# Patient Record
Sex: Female | Born: 1950
Health system: Southern US, Community
[De-identification: ages and names within clinical notes are randomized; demographics above are authoritative.]

## PROBLEM LIST (undated history)

## (undated) DIAGNOSIS — I10 Essential (primary) hypertension: Secondary | ICD-10-CM

## (undated) DIAGNOSIS — K219 Gastro-esophageal reflux disease without esophagitis: Secondary | ICD-10-CM

## (undated) DIAGNOSIS — E785 Hyperlipidemia, unspecified: Secondary | ICD-10-CM

## (undated) HISTORY — DX: Essential (primary) hypertension: I10

## (undated) HISTORY — DX: Gastro-esophageal reflux disease without esophagitis: K21.9

## (undated) HISTORY — DX: Hyperlipidemia, unspecified: E78.5

## (undated) HISTORY — PX: BREAST LUMPECTOMY: SHX2

## (undated) HISTORY — PX: ABDOMINAL HYSTERECTOMY: SHX81

---

## 1999-10-04 ENCOUNTER — Other Ambulatory Visit: Admission: RE | Admit: 1999-10-04 | Discharge: 1999-10-04 | Payer: Self-pay | Admitting: Family Medicine

## 1999-10-17 ENCOUNTER — Encounter: Payer: Self-pay | Admitting: Family Medicine

## 1999-10-17 ENCOUNTER — Encounter: Admission: RE | Admit: 1999-10-17 | Discharge: 1999-10-17 | Payer: Self-pay | Admitting: Family Medicine

## 1999-10-23 ENCOUNTER — Encounter: Payer: Self-pay | Admitting: Family Medicine

## 1999-10-23 ENCOUNTER — Encounter: Admission: RE | Admit: 1999-10-23 | Discharge: 1999-10-23 | Payer: Self-pay | Admitting: Family Medicine

## 1999-11-18 ENCOUNTER — Other Ambulatory Visit: Admission: RE | Admit: 1999-11-18 | Discharge: 1999-11-18 | Payer: Self-pay | Admitting: Obstetrics and Gynecology

## 1999-12-09 ENCOUNTER — Encounter (INDEPENDENT_AMBULATORY_CARE_PROVIDER_SITE_OTHER): Payer: Self-pay | Admitting: Specialist

## 1999-12-09 ENCOUNTER — Ambulatory Visit (HOSPITAL_COMMUNITY): Admission: RE | Admit: 1999-12-09 | Discharge: 1999-12-09 | Payer: Self-pay | Admitting: Obstetrics and Gynecology

## 2000-12-04 ENCOUNTER — Encounter: Payer: Self-pay | Admitting: Family Medicine

## 2000-12-04 ENCOUNTER — Ambulatory Visit (HOSPITAL_COMMUNITY): Admission: RE | Admit: 2000-12-04 | Discharge: 2000-12-04 | Payer: Self-pay | Admitting: Family Medicine

## 2001-05-15 ENCOUNTER — Encounter: Payer: Self-pay | Admitting: Chiropractic Medicine

## 2001-05-15 ENCOUNTER — Encounter: Admission: RE | Admit: 2001-05-15 | Discharge: 2001-05-15 | Payer: Self-pay | Admitting: Chiropractic Medicine

## 2001-08-11 ENCOUNTER — Other Ambulatory Visit: Admission: RE | Admit: 2001-08-11 | Discharge: 2001-08-11 | Payer: Self-pay | Admitting: Obstetrics and Gynecology

## 2001-10-27 ENCOUNTER — Encounter: Admission: RE | Admit: 2001-10-27 | Discharge: 2001-10-27 | Payer: Self-pay | Admitting: Obstetrics and Gynecology

## 2001-10-27 ENCOUNTER — Encounter: Payer: Self-pay | Admitting: Obstetrics and Gynecology

## 2001-10-29 ENCOUNTER — Encounter: Payer: Self-pay | Admitting: Obstetrics and Gynecology

## 2001-10-29 ENCOUNTER — Encounter: Admission: RE | Admit: 2001-10-29 | Discharge: 2001-10-29 | Payer: Self-pay | Admitting: Obstetrics and Gynecology

## 2003-02-15 ENCOUNTER — Other Ambulatory Visit: Admission: RE | Admit: 2003-02-15 | Discharge: 2003-02-15 | Payer: Self-pay | Admitting: Obstetrics and Gynecology

## 2003-02-21 ENCOUNTER — Encounter: Payer: Self-pay | Admitting: Obstetrics and Gynecology

## 2003-02-21 ENCOUNTER — Encounter: Admission: RE | Admit: 2003-02-21 | Discharge: 2003-02-21 | Payer: Self-pay | Admitting: Obstetrics and Gynecology

## 2003-05-11 ENCOUNTER — Ambulatory Visit (HOSPITAL_COMMUNITY): Admission: RE | Admit: 2003-05-11 | Discharge: 2003-05-11 | Payer: Self-pay | Admitting: General Surgery

## 2003-05-29 ENCOUNTER — Encounter: Admission: RE | Admit: 2003-05-29 | Discharge: 2003-05-29 | Payer: Self-pay | Admitting: General Surgery

## 2003-05-30 ENCOUNTER — Ambulatory Visit (HOSPITAL_COMMUNITY): Admission: RE | Admit: 2003-05-30 | Discharge: 2003-05-30 | Payer: Self-pay | Admitting: General Surgery

## 2003-05-30 ENCOUNTER — Ambulatory Visit (HOSPITAL_BASED_OUTPATIENT_CLINIC_OR_DEPARTMENT_OTHER): Admission: RE | Admit: 2003-05-30 | Discharge: 2003-05-30 | Payer: Self-pay | Admitting: General Surgery

## 2003-05-30 ENCOUNTER — Encounter (INDEPENDENT_AMBULATORY_CARE_PROVIDER_SITE_OTHER): Payer: Self-pay | Admitting: Specialist

## 2003-06-02 ENCOUNTER — Emergency Department (HOSPITAL_COMMUNITY): Admission: EM | Admit: 2003-06-02 | Discharge: 2003-06-02 | Payer: Self-pay | Admitting: Emergency Medicine

## 2003-12-26 ENCOUNTER — Inpatient Hospital Stay (HOSPITAL_COMMUNITY): Admission: RE | Admit: 2003-12-26 | Discharge: 2003-12-30 | Payer: Self-pay | Admitting: Psychiatry

## 2003-12-26 ENCOUNTER — Emergency Department (HOSPITAL_COMMUNITY): Admission: EM | Admit: 2003-12-26 | Discharge: 2003-12-26 | Payer: Self-pay | Admitting: Emergency Medicine

## 2004-02-22 ENCOUNTER — Other Ambulatory Visit: Admission: RE | Admit: 2004-02-22 | Discharge: 2004-02-22 | Payer: Self-pay | Admitting: Obstetrics and Gynecology

## 2005-05-30 ENCOUNTER — Encounter: Admission: RE | Admit: 2005-05-30 | Discharge: 2005-05-30 | Payer: Self-pay | Admitting: Dermatology

## 2005-06-06 ENCOUNTER — Other Ambulatory Visit: Admission: RE | Admit: 2005-06-06 | Discharge: 2005-06-06 | Payer: Self-pay | Admitting: Obstetrics and Gynecology

## 2006-02-23 ENCOUNTER — Ambulatory Visit (HOSPITAL_COMMUNITY): Admission: RE | Admit: 2006-02-23 | Discharge: 2006-02-24 | Payer: Self-pay | Admitting: Obstetrics and Gynecology

## 2006-02-23 ENCOUNTER — Encounter (INDEPENDENT_AMBULATORY_CARE_PROVIDER_SITE_OTHER): Payer: Self-pay | Admitting: *Deleted

## 2006-07-16 ENCOUNTER — Encounter: Admission: RE | Admit: 2006-07-16 | Discharge: 2006-07-16 | Payer: Self-pay | Admitting: Dermatology

## 2007-10-01 ENCOUNTER — Ambulatory Visit: Payer: Self-pay | Admitting: Internal Medicine

## 2007-10-19 ENCOUNTER — Ambulatory Visit: Payer: Self-pay

## 2007-10-19 ENCOUNTER — Encounter: Payer: Self-pay | Admitting: Internal Medicine

## 2007-12-22 ENCOUNTER — Ambulatory Visit: Payer: Self-pay | Admitting: Internal Medicine

## 2007-12-30 ENCOUNTER — Ambulatory Visit: Payer: Self-pay | Admitting: Pulmonary Disease

## 2007-12-30 DIAGNOSIS — J309 Allergic rhinitis, unspecified: Secondary | ICD-10-CM | POA: Insufficient documentation

## 2007-12-30 DIAGNOSIS — I10 Essential (primary) hypertension: Secondary | ICD-10-CM | POA: Insufficient documentation

## 2007-12-30 DIAGNOSIS — E785 Hyperlipidemia, unspecified: Secondary | ICD-10-CM | POA: Insufficient documentation

## 2007-12-30 DIAGNOSIS — Z85820 Personal history of malignant melanoma of skin: Secondary | ICD-10-CM | POA: Insufficient documentation

## 2007-12-30 DIAGNOSIS — J45909 Unspecified asthma, uncomplicated: Secondary | ICD-10-CM | POA: Insufficient documentation

## 2008-01-21 DIAGNOSIS — G4733 Obstructive sleep apnea (adult) (pediatric): Secondary | ICD-10-CM | POA: Insufficient documentation

## 2008-01-22 ENCOUNTER — Encounter: Payer: Self-pay | Admitting: Pulmonary Disease

## 2008-01-22 ENCOUNTER — Ambulatory Visit (HOSPITAL_BASED_OUTPATIENT_CLINIC_OR_DEPARTMENT_OTHER): Admission: RE | Admit: 2008-01-22 | Discharge: 2008-01-22 | Payer: Self-pay | Admitting: Pulmonary Disease

## 2008-01-27 ENCOUNTER — Ambulatory Visit: Payer: Self-pay | Admitting: Pulmonary Disease

## 2008-03-14 ENCOUNTER — Ambulatory Visit: Payer: Self-pay | Admitting: Pulmonary Disease

## 2008-05-24 ENCOUNTER — Encounter: Payer: Self-pay | Admitting: Pulmonary Disease

## 2008-05-29 ENCOUNTER — Ambulatory Visit: Payer: Self-pay | Admitting: Pulmonary Disease

## 2008-05-31 ENCOUNTER — Telehealth: Payer: Self-pay | Admitting: Pulmonary Disease

## 2008-06-15 IMAGING — US US EXTREM LOW VENOUS BILAT
1 series · 14 of 24 positions shown · non-contrast
Comparison: None

CLINICAL DATA: Melanoma. Increased lower extremity edema for 3 weeks.

Bilateral lower extremity Doppler venous ultrasound:

[Series 1: unknown · 14 of 34 slices shown]
[im 1/34]
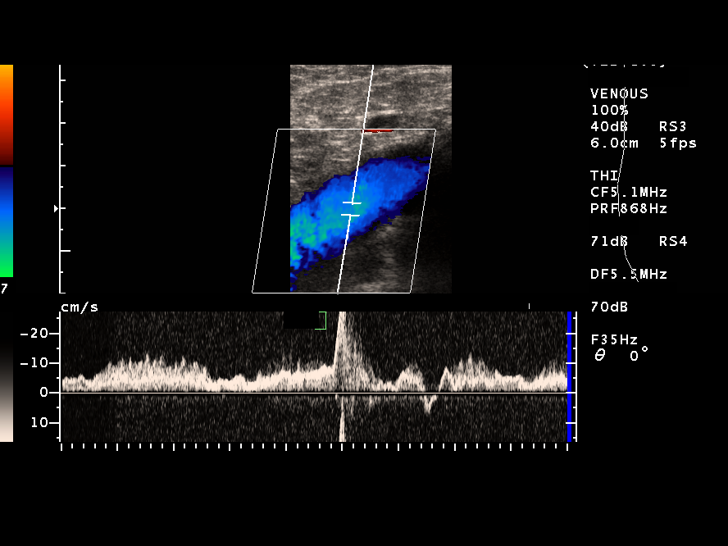
[im 3/34]
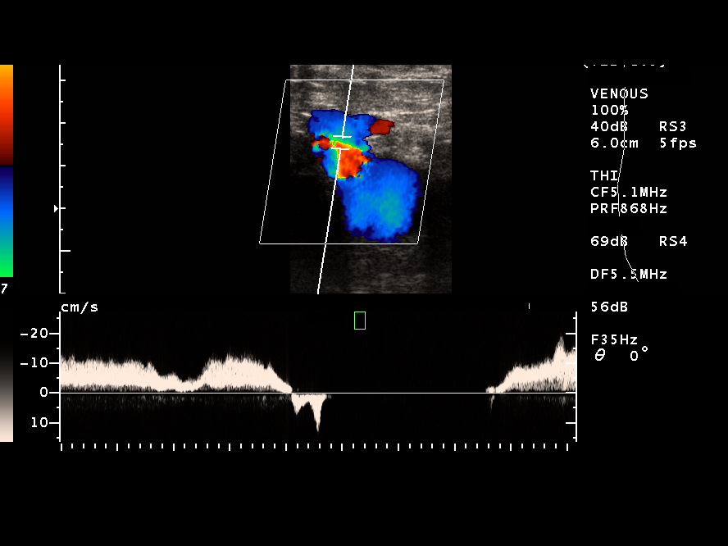
[im 6/34]
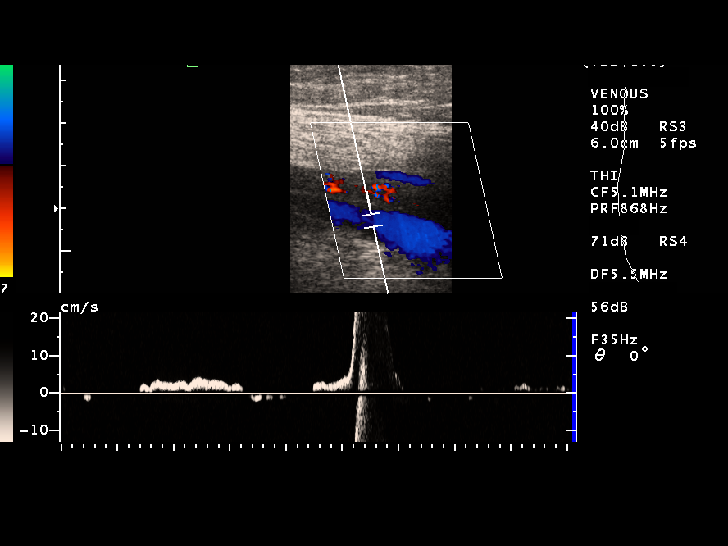
[im 9/34]
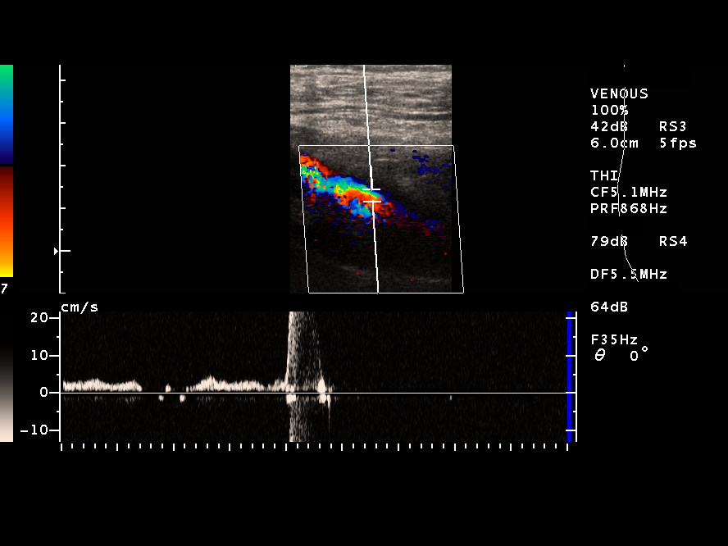
[im 11/34]
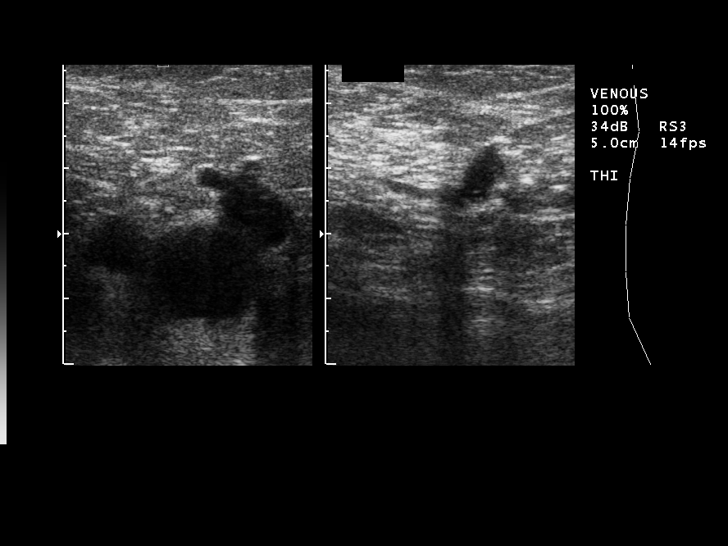
[im 13/34]
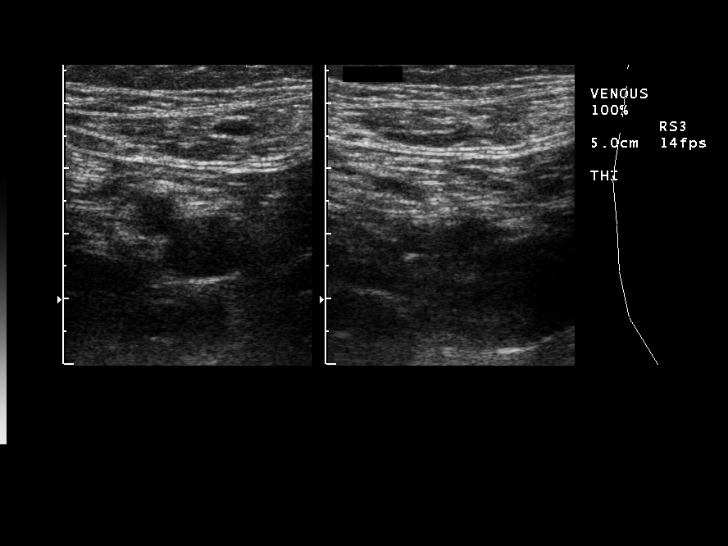
[im 16/34]
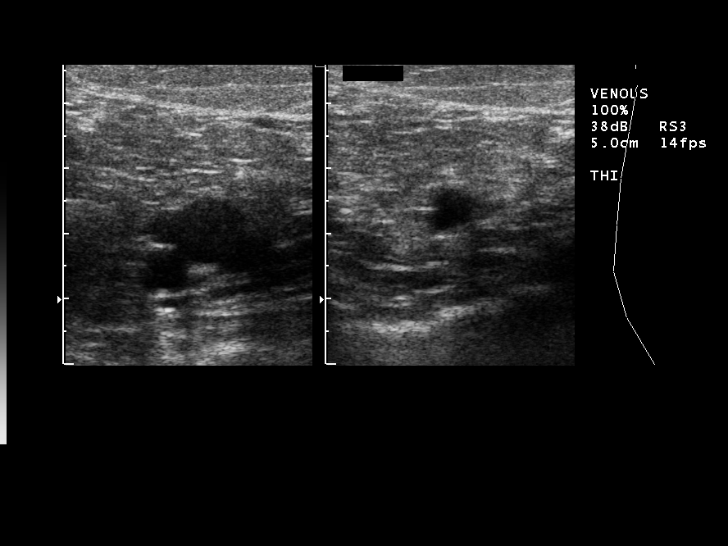
[im 18/34]
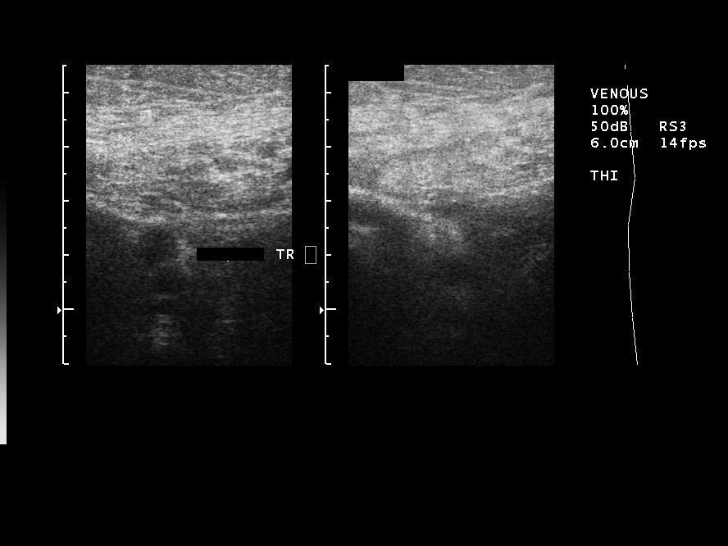
[im 21/34]
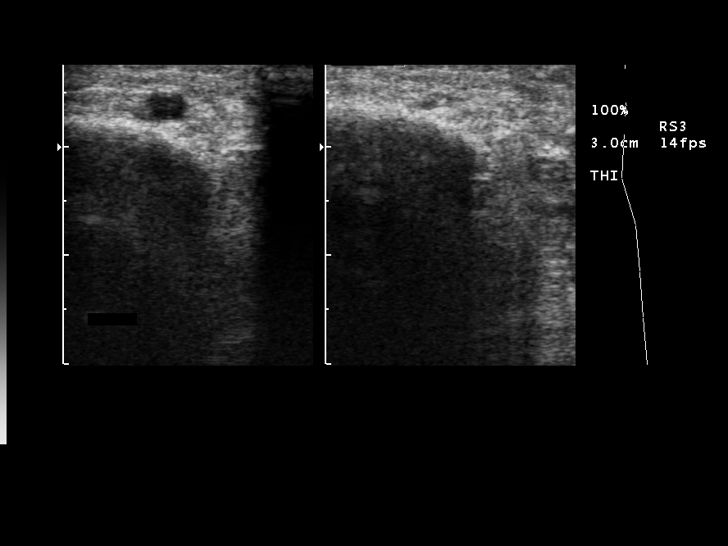
[im 23/34]
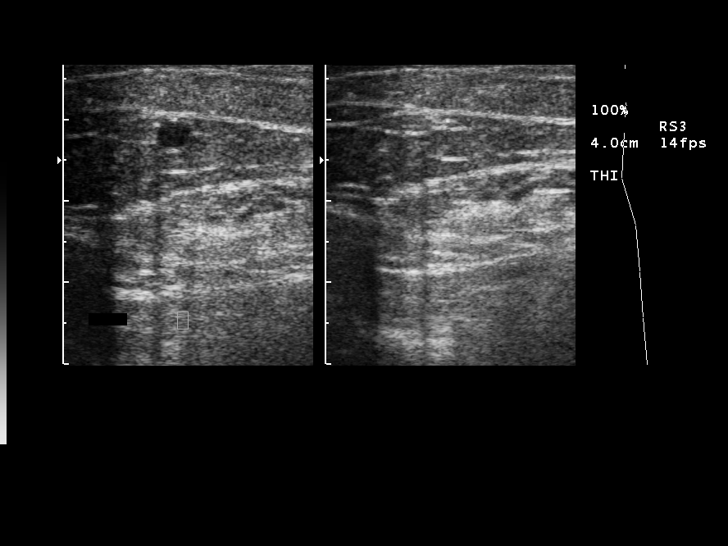
[im 26/34]
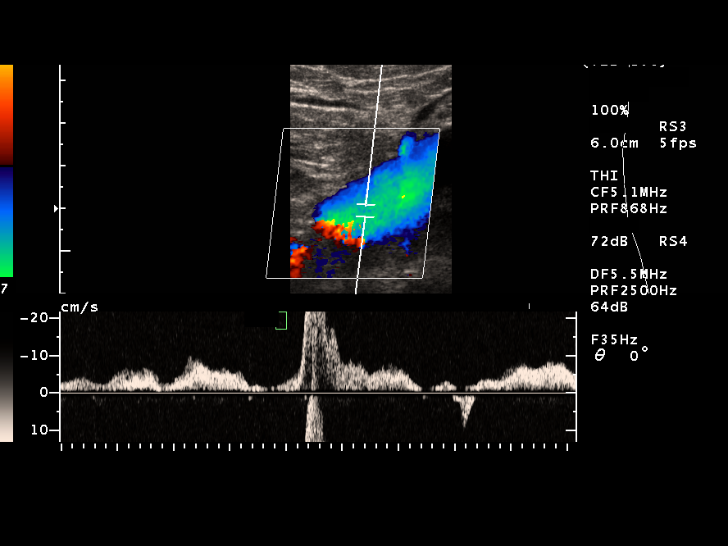
[im 28/34]
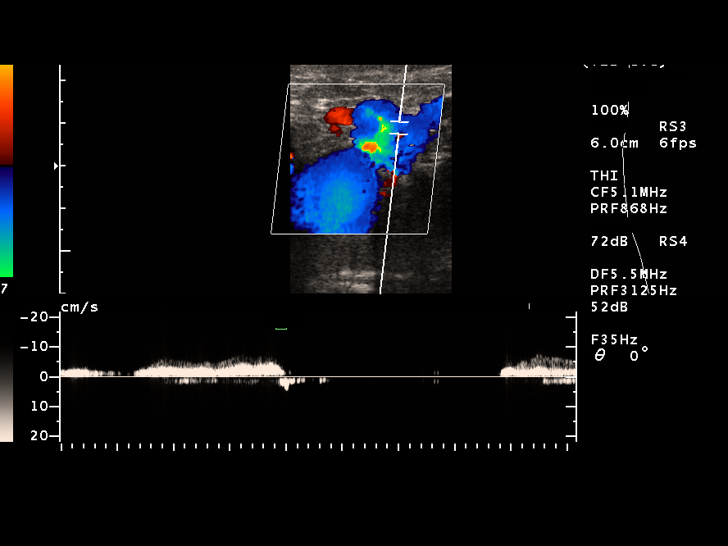
[im 31/34]
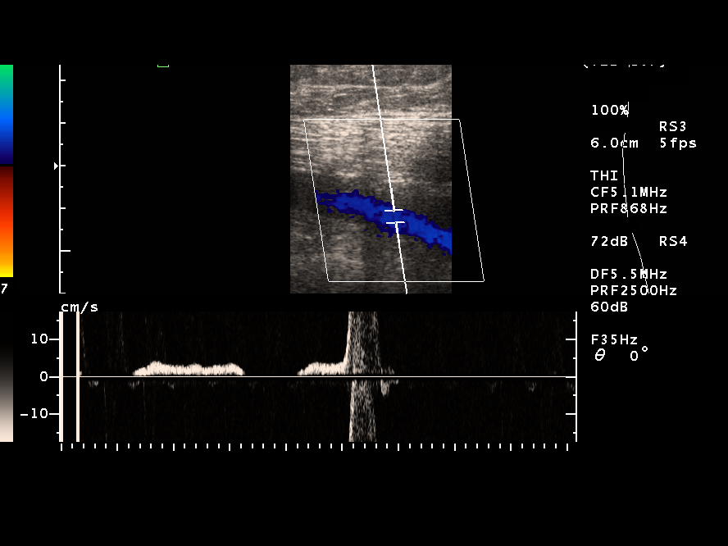
[im 34/34]
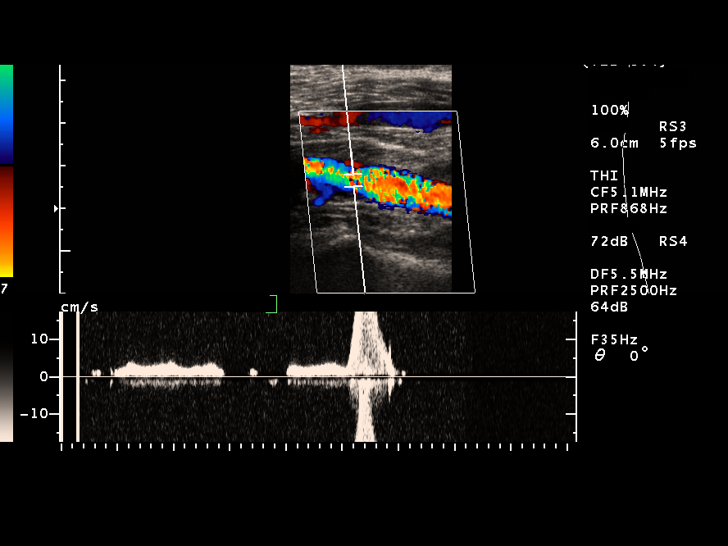

[14 of 24 positions shown; findings below may reference images not displayed]

FINDINGS: There is normal compressibility, phasicity, augmentation, and flow in
the common femoral, superficial femoral, and popliteal veins bilaterally.
Greater saphenous veins appear normal. The posterior tibial veins appear
compressible where visualized bilaterally.

Several images were obtained through the region where there is a palpable lump,
reportedly in the left calf.  These demonstrate subcutaneous edema but no
definite subcutaneous mass.
IMPRESSION: 1. No evidence of deep vein thrombosis.

## 2010-06-18 ENCOUNTER — Encounter
Admission: RE | Admit: 2010-06-18 | Discharge: 2010-06-18 | Payer: Self-pay | Source: Home / Self Care | Attending: Obstetrics and Gynecology | Admitting: Obstetrics and Gynecology

## 2010-10-15 NOTE — Assessment & Plan Note (Signed)
[facility]                            CARDIOLOGY OFFICE NOTE   Carla [name], Carla [name]                      MRN:          [id number]  DATE:10/01/2007                            DOB:          1950/09/28    PRIMARY CARE PHYSICIAN/REFERRING PHYSICIAN:  Dr. Leonides Sake with [facility].   REASON FOR CONSULTATION:  Shortness of breath and lower extremity edema.   HISTORY:  Carla [name] is a very pleasant 60 year old woman with no history  of cardiac problems.  She does have a history of hypertension, asthma,  obesity, borderline diabetes and gastroesophageal reflux disease.   She has never had a stress test or cardiac catheterization.  About 4  years ago, she was undergoing treatment for a melanoma.  She received a  significant amount of IV fluids and developed some lower extremity  edema.  Echocardiogram reportedly at that time showed normal LV function  with just mild valvular disease.   Over the past 10 years, she says she has gained almost 80-100 pounds and  really knows that she has become more short of breath, and had some  dependent edema after long days.  She denies any chest pain with this.  She has not had orthopnea, no PND.  She does note that she has marked  snoring and often falls asleep at her desk at work.  She is concerned  she has sleep apnea.  Her previous husband had it.  She says there is no  way she could wear the mask.   REVIEW OF SYSTEMS:  Notable for borderline diabetes, depression,  fatigue, allergies, reflux disease and bladder spasms which have  resolved.  Remainder of the review of systems is negative except for HPI  and problem list.   PAST MEDICAL HISTORY:  1. Includes hypertension.  2. Asthma.  3. Obesity.  4. Borderline diabetes.  5. Gastroesophageal reflux disease.  6. Depression.  7. History of melanoma status post resection.   CURRENT MEDICATIONS:  1. Benicar/HCTZ 20/12.5.  2. Zocor 40.  3. Lasix  20.  4. Lamictal 150 a day.  5. Effexor 150 a day.  6. Protonix 150 a day.  7. Astelin nasal spray.  8. Divigel which is a low-dose hormone.   ALLERGIES:  NONE.   SOCIAL HISTORY:  She is single.  She has 3 kids.  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  Denies any tobacco, quit 1978.  Occasional alcohol.   FAMILY HISTORY:  Notable for mother who is still alive at 14.  Father  died from heart failure.  A sister who died from cirrhosis.  A sister  and 2 other brothers are alive and well.  No family history of premature  coronary artery disease.   PHYSICAL EXAMINATION:  GENERAL:  She is in no acute distress.  She  ambulates around the clinic without any respiratory difficulty.  VITAL SIGNS:  Blood pressure is 114/68, heart rate 77, weight is 232.  HEENT:  Normal.  NECK:  Supple.  There is no  JVD.  Carotids are 2+ bilaterally without  bruits.  There is no lymphadenopathy or thyromegaly.  CARDIAC:  PMI is nondisplaced.  Irregular rate and rhythm.  No murmurs,  rubs or gallops.  LUNGS:  Clear.  ABDOMEN:  Obese, nontender, nondistended.  No hepatosplenomegaly, no  bruits, no masses.  Good bowel sounds.  EXTREMITIES:  Warm with no cyanosis or clubbing.  There is trace edema.  No rash.  NEURO:  Alert and oriented x3.  Cranial nerves II-XII are  intact.  Moves all 4 extremities without difficulty.  Affect is very  pleasant.   DIAGNOSTICS:  EKG shows sinus rhythm at a rate of 77, no ST/T wave  abnormalities.   ASSESSMENT/PLAN:  1. Dyspnea.  I suspect like she does that this is primarily due to her      weight, but she does have multiple cardiac risk factors.  I think      it is very reasonable to get a stress echocardiogram to reassess      her valvular disease and also rule out underlying ischemia.  2. Obesity.  We had a long talk about this.  I have asked her to join      [employer] and have a dedicated  approach to try to lose some      weight over the next 2 months.  3. Probable sleep apnea.  She is at very high risk for sleep apnea.      However, she is very concerned about her ability to wear the mask.      I have made a deal with her and said that if she can try aggressive      weight loss for the next 2 months and be successful at this, we      will forego her sleep study for now.  However, if she comes back      and she has been unsuccessful with her weight loss, then I will      push her to get a sleep study and try the mask.  4. Chronic hypertension, well controlled.   DISPOSITION:  We will see her back in clinic in 2 months for followup.     Bevelyn Buckles. Bensimhon, MD  Electronically Signed    DRB/MedQ  DD: 10/01/2007  DT: 10/01/2007  Job #: [id number]

## 2010-10-15 NOTE — Procedures (Signed)
NAME:  Carla [name], Carla [name]               ACCOUNT NO.:  [id number]   MEDICAL RECORD NO.:  [id number]          PATIENT TYPE:  OUT   LOCATION:  SLEEP CENTER                 FACILITY:  [facility]   PHYSICIAN:  Coralyn Helling, MD        DATE OF BIRTH:  1950-10-04   DATE OF STUDY:  01/22/2008                            NOCTURNAL POLYSOMNOGRAM   REFERRING PHYSICIAN:  Coralyn Helling, MD   INDICATION:  Ms. Sizemore is a 60 year old female who has a history of  hypertension, diabetes and depression.  She is referred to the sleep lab  for evaluation of hypersomnia with obstructive sleep apnea.   Height is 5 feet 8 inches, weight is 230 pounds, BMI is 35, neck size is  15 inches.   MEDICATIONS:  1. Sonata.  2. Advair.  3. Benicar.  4. Zocor.  5. Effexor.  6. Lamictal.  7. Protonix.  8. EstroGel.  9. Furosemide.   The patient took a Sonata at 9 p.m. on the night of the study.  Epworth  score of 15.   SLEEP ARCHITECTURE:  The patient was involved in a split night study  protocol.   During the diagnostic portion of the test, total recording time was 159  minutes, total sleep time was 124 minutes.  Sleep efficiency was 78%.  Sleep latency was 33 minutes, which was prolonged.  This portion of the  study was notable for lack of slow wave sleep and REM sleep.  Of note is  that it appeared that she had a significant amount of slow rolling eye  movements.   During the titration portion of the test, total recording time was 245  minutes.  Total sleep time was 98 minutes.  Sleep efficiency was 40%.  Sleep latency was one minute.  REM latency was 50 minutes.  This portion  of the test was notable for lack of slow wave sleep.  The patient slept  in both the supine and non-supine positions.   RESPIRATORY DATA:  The average respiratory rate was 18.   During the diagnostic portion of the test, the overall apnea-hypopnea  index was 16.4.  The events were exclusively obstructive in nature.  Moderate snoring was  noted by the technician.  The non-supine apnea-  hypopnea index was 10.  The supine apnea-hypopnea index was 3.   During the titration portion of the study, the patient was titrated from  a CPAP pressure setting of 4 to 21 cm H2O.  At a CPAP pressure setting  of 12 cm H2O, the apnea-hypopnea index was reduced to zero.  At this  pressure setting, she was observed in REM sleep but not supine sleep.  Of note is at a higher pressure setting she appeared to have  difficulties with sleep maintenance, as well as central apneic events.   OXYGEN DATA:  The baseline oxygenation was 74%.  The oxygen saturation  nadir was 86%.  At a CPAP pressure setting of 12 cm H2O, the oxygen  saturation nadir was 91%.   CARDIAC DATA:  The average heart rate was 80, and the rhythm strip  showed normal sinus rhythm.   MOVEMENT AND PARASOMNIA:  The periodic limb movement index was zero.   IMPRESSION:  This study shows evidence for moderate obstructive sleep  apnea with an apnea-hypopnea index of 16 and an oxygen saturation nadir  of 86%.  She did have a significant positional component to her sleep  apnea.  During the therapeutic portion of the test, she was titrated to  a CPAP pressure setting of 12 cm H2O with a reduction in her apnea-  hypopnea index to zero.  She was observed in REM sleep at this pressure  setting.  Of note is that at higher pressures, she had difficulty with  sleep maintenance, as well as development of central apneic events.   In addition to diet, exercise and weight reduction, the patient should  also try positional therapy.  She should also be started on CPAP at 12  cm H2O and followed up for her clinical response.      Coralyn Helling, MD  Diplomat, American Board of Sleep Medicine  Electronically Signed     VS/MEDQ  D:  01/27/2008 16:10:96  T:  01/27/2008 10:48:01  Job:  [id number]

## 2010-10-15 NOTE — Assessment & Plan Note (Signed)
[facility]                            CARDIOLOGY OFFICE NOTE   Carla [name], Carla [name]                      MRN:          [id number]  DATE:12/22/2007                            DOB:          05/19/1951    PRIMARY CARE PHYSICIAN:  Holley Bouche, MD, with [facility]   INTERVAL HISTORY:  Carla [name] is very pleasant 60 year old woman with a  history of hypertension, asthma, obesity, borderline diabetes and  gastroesophageal reflux disease.   I saw her for the first time in May 2009, when she was complaining of  significant dyspnea on exertion.  Since that time, she underwent an  echocardiogram which showed an EF of 65%; however, there was a  pseudonormal filling pattern suggestive of significant diastolic  dysfunction.  There was no significant valvular disease.  Estimated  pulmonary pressures were normal.  She also underwent a stress  echocardiogram, which showed no evidence of stress-induced wall motion  abnormality.   At that time, I thought most of her dyspnea was likely due to her  weight.  We have suggested weight loss.  She also has evidence of sleep  apnea with very persistent fatigue and heavy snoring.   She was previously reluctant to undergo a sleep study.  She thought she  could not wear mass, but talked to one of her friends who has been  treated for sleep apnea and is doing wonderfully, so now she is more  receptive to it.  She does have occasional lower extremity edema but  manages this with her HCTZ.   CURRENT MEDICATIONS:  1. Zocor 40 a day.  2. Lasix 20 a day.  3. Lamictal 150 a day.  4. Effexor 150 a day.  5. Protonix.  6. Benicar HCT 20/12.5 a day.   PHYSICAL EXAMINATION:  GENERAL:  She is well-appearing, in no acute  distress, ambulates around the clinic without any respiratory  difficulty.  VITAL SIGNS:  Blood pressure is 118/76, heart rate 76, weights 228 which  is down 4 pounds.  HEENT:  Normal.  NECK:   Supple.  No JVD.  Carotids are 2+ bilaterally without bruits.  There is no lymphadenopathy or thyromegaly.  CARDIAC:  PMI is nondisplaced.  She is regular with 2/6 systolic  ejection murmur at left sternal border.  There is no rub or gallop.  LUNGS:  Clear.  ABDOMEN:  Obese, nontender, and nondistended.  No hepatosplenomegaly.  No bruits.  No masses.  Good bowel sounds.  EXTREMITIES:  Warm with no cyanosis, clubbing, or edema.  SKIN:  No rash.  NEURO:  Alert and orientedx3.  Cranial nerves II-XII are intact.  Moves  all 4 extremities without difficulty.  Affect is pleasant.   ASSESSMENT/PLAN:  1. Dyspnea.  This has improved.  She does have evidence of significant      diastolic dysfunction in combination with her obesity, makes her a      set up for worsening or dyspnea.  I made a very clear to her that      she needs to continue with her weight loss efforts.  We  discussed      possibly going to Dr. Kinnie Scales or [employer].  She will try this      on her own first.  We will also stress in importance of regular      exercise.  2. Probable sleep apnea.  She has agreed to go for a sleep study.  We      will send her to Dr. Craige Cotta.   DISPOSITION:  We will see her back in 6 months for routine followup in  the check up on her weight loss.     Bevelyn Buckles. Bensimhon, MD  Electronically Signed    DRB/MedQ  DD: 12/22/2007  DT: 12/23/2007  Job #: [id number]   cc:   Coralyn Helling, MD

## 2010-10-18 NOTE — H&P (Signed)
Carla [name], [name]               ACCOUNT NO.:  [id number]   MEDICAL RECORD NO.:  [id number]         PATIENT TYPE:  WOIB   LOCATION:                                FACILITY:  [facility]   PHYSICIAN:  Duke Salvia. Marcelle Overlie, M.D.    DATE OF BIRTH:   DATE OF ADMISSION:  02/23/2006  DATE OF DISCHARGE:                                HISTORY & PHYSICAL   Date of scheduled surgery is September 24.   CHIEF COMPLAINT:  Pelvic pain with abnormal uterine bleeding, leiomyoma.   HPI:  A 60 year old G4, P3 with abnormal bleeding and symptomatic cystocele  and rectocele, fibroids.  Her evaluation, thus far, has shown last  ultrasound, January 2007, one pedunculated 4.9 x 4.0 x 4.7 and a number of  others that are smaller.  She has also had a recent endometrial biopsy,  July 03, 2005, that showed fragmented proliferative endometrium with  features of stromal and glandular breakdown.  No definite hyperplasia was  noted.   Due to continue problems with heavy irregular bleeding and pelvic pain along  with symptomatic rectocele, she presents now at this time for LAVH, BSO, A  and P repair.  The procedure including risk of bleeding, infection, adjacent  organ injury, transfusion, the possible need for an open or additional  surgery all discussed with her which she understands and accepts.  She has  currently been on Loestrin 2 pills per day to regular her heavy bleeding.   PAST MEDICAL HISTORY:   ALLERGIES:  1. LATEX.  2. DUST MITES.  3. RAGWEED.  4. MOLD.   CURRENT MEDICATIONS:  1. Effexor.  2. Lamictal.  3. Albuterol p.r.n.  4. Benicar.  5. Zocor.  6. Loestrin 2 per day.   OBSTETRICAL HISTORY:  Three vaginal deliveries at term.  She has had  excision of a melanoma on her back with Sentinel node in the axilla that was  negative with negative followup since that time.   FAMILY HISTORY:  Significant for a mother and father with diabetes.  There  is a family history of breast and prostate  cancer with a history of maternal  heart disease.  She is a nonsmoker.   PHYSICAL EXAMINATION:  Temp 98.2.  Blood pressure 126/80.  HEENT:  Unremarkable.  NECK:  Supple without masses.  LUNGS:  Clear.  CARDIOVASCULAR:  Regular rate and rhythm without murmurs, rubs, or gallops.  BREASTS:  Without masses.  ABDOMEN:  Soft, flat, nontender.  PELVIC:  Normal external genitalia.  Vagina and cervix reveal a small  cystocele and moderate rectocele.  Her support was otherwise normal at the  cuff.  The uterus, itself, was 8 weeks' size, mobile.  Adnexa negative.  EXTREMITIES:  Unremarkable.  NEUROLOGIC:  Unremarkable.   IMPRESSION:  1. Abnormal uterine bleeding.  History of normal biopsy.  2. Symptomatic leiomyoma.  3. Symptomatic cystocele and rectocele.   PLAN:  LAVH, BSO, A and P repair.  This procedure discussed in detail  including risks as reviewed above.      Richard M. Marcelle Overlie, M.D.  Electronically Signed     RMH/MEDQ  D:  02/18/2006  T:  02/18/2006  Job:  [id number]

## 2010-10-18 NOTE — Op Note (Signed)
NAME:  Mceuen, Dierdre               ACCOUNT NO.:  [id number]   MEDICAL RECORD NO.:  [id number]          PATIENT TYPE:  AMB   LOCATION:  SDC                           FACILITY:  [facility]   PHYSICIAN:  Duke Salvia. Marcelle Overlie, M.D.DATE OF BIRTH:  06-Aug-1950   DATE OF PROCEDURE:  02/23/2006  DATE OF DISCHARGE:                                 OPERATIVE REPORT   PREOPERATIVE DIAGNOSIS:  Symptomatic leiomyoma, cystocele and rectocele.   POSTOPERATIVE DIAGNOSIS:  Symptomatic leiomyoma, cystocele and rectocele.   PROCEDURE:  Laparoscopically assisted vaginal hysterectomy with bilateral  salpingo-oophorectomy, anterior-posterior repair.   SURGEON:  Duke Salvia. Marcelle Overlie, M.D.   ASSISTANT:  Dineen Kid. Rana Snare, M.D.   ESTIMATED BLOOD LOSS:  400 mL.   SPECIMENS REMOVED:  Bilateral tubes and ovaries, uterus and vaginal mucosa.   PROCEDURE AND FINDINGS:  The patient was taken to the operating room and  after an adequate level of general endotracheal anesthesia was obtained with  the legs in stirrups, the abdomen, perineum, and vagina were prepped and  draped in the usual manner for laparoscopy.  The bladder was drained. EUA  was carried out revealing the uterus to be 10 weeks size, adnexa negative,  uterus was mobile.  A Hulka tenaculum was positioned.  A 2-cm subumbilical  incision was made after infiltrating with 0.5% Marcaine plain.  The Veress  needle was introduced without difficulty.  Its intra-abdominal position was  verified by pressure and water testing.  After a 2 liter pneumoperitoneum  was created, the laparoscopic trocar and sleeve were then introduced without  difficulties.  3 fingerbreadths above the symphysis in the midline, a 5 mm  trocar was inserted under direct visualization.   The uterus, itself, was ten weeks size with significantly enlarged partially  pedunculated posterior fundal fibroid. Adnexa unremarkable. With the uterus  placed on tension from below, the adnexa on the right was  grasped and placed  on traction toward the midline.  The course of the ureter was well below,  the IP ligament was then coagulated and cut down to including the round  ligament with the gyrus PK instrument with excellent hemostasis.  The exact  same repeated on the opposite side after carefully identifying the course of  the ureter well below.  Once this was completed, the vaginal portion of the  procedure was started.   The weighted speculum was positioned.  Her legs were extended.  The cervical  vaginal mucosa was incised with the Bovie.  A posterior colpotomy performed  without difficulty.  The bladder was advanced superiorly with sharp and  blunt dissection.  The handheld gyrus PK instrument was then used to  coagulate and cut the uterosacral ligament, cardinal ligament, and uterine  vasculature pedicles.  A portion of the uterus was then morcellated, at that  point, until the fundus could be delivered and a large posterior fibroid  could be removed.  When this was completed, the surgeon's finger was then  used to trace the anterior peritoneum which was entered sharply and a  retractor used to gently elevate the bladder out of the field.  The  remaining pedicles were clamped, divided and free tied with 0 Vicryl suture.  The vaginal cuff was then closed from 3 to 9 o'clock with a running locked 2-  0 Vicryl suture.  All major pedicles were noted to be hemostatic.  The  mucosa was closed from right-to-left with interrupted 2-0 Monocryl suture.  The cystocele was then repaired by dividing the mucosa in the midline from  the area of the UV angle 3/4 of the way to the cuff. The underlying  perivesical fascia was then separated with sharp and blunt dissection.  A 2-  0 Vicryl suture was then used to plicate the fascia, reducing the cystocele.  Excess mucosa was trimmed and closed with 2-0 Vicryl interrupted sutures.  The posterior repair was performed by excising a small triangle of  perineal  skin dividing the posterior mucosa in the midline 3/4 the way up the  posterior wall. The surrounding perirectal fascia was divided free reducing  the rectocele.  The perirectal fascia was reapproximated in the midline with  2-0 Vicryl sutures.  A small amount of excess mucosa was trimmed and the  mucosa reapproximated with 2-0 Vicryl sutures and was 3-0 Vicryl repeat  sutures on the perineum.  A 1 inch pack was placed with good hemostasis.  The Foley catheter was positioned draining clear urine.  The patient was  then reinsufflated abdominally and the scope was used to carry out  inspection and irrigation. After irrigation and aspiration, with reduction  of pressure, the operative site pedicles were noted to be hemostatic.  The  instruments were removed, gas allowed to escape.  The wounds were closed  with 4-0 Dexon subcuticular sutures and Dermabond.  She tolerated this well  and went to the recovery room in good condition.      Richard M. Marcelle Overlie, M.D.  Electronically Signed     RMH/MEDQ  D:  02/23/2006  T:  02/24/2006  Job:  [id number]

## 2010-10-18 NOTE — Discharge Summary (Signed)
NAME:  Chrismer, Arlynn               ACCOUNT NO.:  [id number]   MEDICAL RECORD NO.:  [id number]          PATIENT TYPE:  OIB   LOCATION:  9305                          FACILITY:  [facility]   PHYSICIAN:  Duke Salvia. Marcelle Overlie, M.D.DATE OF BIRTH:  29-Sep-1950   DATE OF ADMISSION:  02/23/2006  DATE OF DISCHARGE:  02/24/2006                                 DISCHARGE SUMMARY   DISCHARGE DIAGNOSES:  1. Symptomatic uterine leiomyoma, small cystocele and moderate rectocele,      symptomatic.  2. Laparoscopic-assisted vaginal hysterectomy/bilateral salpingo-      oophorectomy, anterior and posterior repair this admission.   SUMMARY OF THE HISTORY AND PHYSICAL EXAMINATION:  Please see the admission  H&P for details.  Briefly, a 60 year old G4, P3 with symptomatic cystocele,  rectocele and abnormal bleeding secondary to fibroids.  Endometrial biopsy  February 2007 showed fragmented proliferative endometrium, no hyperplasia  noted.  She presents at this time for definitive hysterectomy.   HOSPITAL COURSE:  On February 23, 2006, under general anesthesia the  patient underwent LAVH/BSO with a uterus that was 300+ g removed, A&P  repair.  The following a.m. the catheter was removed.  She was afebrile,  tolerating a regular diet, ambulating without difficulty, voiding after the  catheter was removed, and ready for discharge by 1:30 p.m. the first  postoperative day.   LABORATORY DATA:  Blood type is A negative, antibody screen negative.  CMET  on admission normal except for sodium 134, glucose 110.  CBC:  Hemoglobin  12.6; WBC 9.3; platelets 413,000.  Postoperative CBC on September 25:  Hemoglobin 9.7, WBC 12.2, hematocrit 28.5.   DISPOSITION:  The patient is discharged on Tylox p.r.n. pain.  Will return  to the office in 1 week.  Advised to report any incisional redness or  drainage, increased pain or bleeding, or fever over 101.  She was given  specific instructions regarding diet, sex, exercise.  Advised  to take Colace  one p.o. b.i.d. and Slow Fe or Feosol (OTC) once daily.   CONDITION:  Good.   ACTIVITY:  Graded increase.      Richard M. Marcelle Overlie, M.D.  Electronically Signed     RMH/MEDQ  D:  02/24/2006  T:  02/26/2006  Job:  [id number]

## 2010-10-18 NOTE — Op Note (Signed)
[facility]  Patient:    Carla [name], Carla [name]                        MRN: [id number] Proc. Date: 12/09/99 Attending:  Aram Beecham P. Ashley Royalty, M.D. CC:         Edwena Felty. Ashley Royalty, M.D.                           Operative Report  PREOPERATIVE DIAGNOSES:       Intermenstrual bleeding, known endometrial polyp and endometrial hyperplasia.  POSTOPERATIVE DIAGNOSES:      Intermenstrual bleeding, known endometrial polyp and endometrial hyperplasia, pathology pending.  PROCEDURE:                    Dilatation and curettage with hysteroscopy.  SURGEON:                      Cynthia P. Ashley Royalty, M.D.  ANESTHESIA:                   Modified anesthesia care, then general by LMA.  ESTIMATED BLOOD LOSS:         50 cc.  COMPLICATIONS:                None.  DESCRIPTION OF PROCEDURE:     The patient was taken to the operating room and was given IV sedation per anesthesia.  She was placed in the dorsal lithotomy position and prepped and draped in usual fashion.  The cervix was visualized and grasped on its anterior lip with a single-tooth tenaculum.  Paracervical block was instituted by injecting 10 cc of 1% Xylocaine plain at each of 3 and 9 oclock.  Uterus then sounded to 8 cm.  The cervix was dilated with very little resistance to a #27 [employer]. Diagnostic hysteroscope was introduced. There were several areas of endometrial thickening noted and a polyp in the anterior wall on the right consistent with the sonohysterogram.  D&C was done but caused the patient to have some significant discomfort and to tighten up her legs and it was felt that it would be best to put her under general anesthesia, which was then done; when she relaxed, the procedure continued. After completing the D&C, the hysteroscope was reintroduced and there were still felt to be some shaggy endometrium that needed to be removed.  Curettage was then done again and a large amount of tissue was removed.   Hysteroscopy was then repeated and it was felt that the endometrial cavity and the endocervical canal were clear.  The instruments were removed from the uterus and the tenaculum was removed from the cervix and the procedure was terminated.  There was a 60 cc fluid deficit on the sorbitol that had been used for the distention medium.  Pump was set at 70 mmHg for most of the case and was increased in the last hysteroscopy to 85 because of the vigorous bleeding in the cavity from the curettage.  Patient tolerated the procedure well and went in satisfactory condition to postanesthesia recovery. DD:  12/09/99 TD:  12/09/99 Job: [id number]

## 2010-10-18 NOTE — Op Note (Signed)
NAME:  Carla [name], Carla [name]                         ACCOUNT NO.:  [id number]   MEDICAL RECORD NO.:  [id number]                   PATIENT TYPE:  AMB   LOCATION:  DSC                                  FACILITY:  [facility]   PHYSICIAN:  Timothy E. Earlene Plater, M.D.              DATE OF BIRTH:  [birth year]   DATE OF PROCEDURE:  05/30/2003  DATE OF DISCHARGE:                                 OPERATIVE REPORT   PREOPERATIVE DIAGNOSIS:  Melanoma of left back.   POSTOPERATIVE DIAGNOSIS:  Melanoma of left back.   PROCEDURE:  1. Injection of Lymphazurin dye at the melanoma site.  2. Right axillary sentinel node biopsy.  3. Excision of melanoma, left back.   SURGEON:  Timothy E. Earlene Plater, M.D.   ANESTHESIA:  General.   INDICATIONS FOR PROCEDURE:  The patient was seen and evaluated by  dermatology for an unusual lesion on the back.  A punch biopsy was done  showing a 1.2 mm Clark's II stage melanoma.  The patient was seen here in  the office, evaluated and prepared for surgery.  A Lymphoscintigraphy was  accomplished showing clear drainage of the left lower back to the right  axilla.  The patient was seen and counseled and surgery scheduled  accordingly.  She agrees and understands.  She was seen and evaluated today,  identified and the operative sites marked.   DESCRIPTION OF PROCEDURE:  The patient was then taken to the operating room,  placed supine and general endotracheal anesthesia administered.  The patient  was rolled gently to the right.  Lymphazurin dye was injected around the  melanoma site and massaged for five minutes.  Also technetium had been  injected in the melanoma site two hours prior to surgery.   Then with the patient supine, the right arm carefully outstretched, padded  and positioned the right axilla was prepped was prepped with Hibiclens and  draped off as a sterile field.  Using the hand held Neo-Probe, one hot spot  in the mid axilla was identified.  This area was anesthetized with  1/4%  Marcaine with epinephrine and an incision made.  There was very thick and  abundant fat.  This was dissected through and in the approximate mid axilla,  although deep to the skin, there was one hot stained node.  This was  identified, bluntly dissected and removed.  There was no bleeding or  complication.  This was submitted in saline to the pathology department for  their analysis.  That wound was closed in layers with 3-0 Monocryl.  Steri-  Strips and a dry sterile dressing applied.   The patient was then gently repositioned from the operating table to a  gurney and then with proper precautions was turned prone onto the operating  table. All areas were checked and position was good.  The melanoma was  located on the left lower back.  It was by careful measurement  22 x 12 mm,  horizontal axis 22 mm.  This area was carefully visualized and 2 cm both  superior and inferior to the melanoma were marked and then an ellipse was  created to enclose that margin in a horizontal fashion with the final  measurement being 14 x 5 mm.  This was then incised and taken down to the  muscular fascia and removed.  The specimen was carefully marked and  submitted fresh to pathology.  Bleeding was completely controlled.  Flaps  were raised extensively and then the deep subcutaneous, i.e., Scarpa's  fascia, could be easily closed with sutures of 2-0 Vicryl.  Retention  sutures were placed in the skin times three with 2-0 nylon and then the skin  was closed with wide skin staples.  The patient tolerated it well.  A dry  sterile dressing was applied.  She was then carefully turned supine,  extubated and taken to the recovery room in good condition.  All counts were  correct in both areas of work.  Careful written and verbal instructions were  given to her and her family and she will be seen and followed as an  outpatient.                                               Timothy E. Earlene Plater, M.D.     TED/MEDQ  D:  05/30/2003  T:  05/30/2003  Job:  [id number]

## 2010-10-18 NOTE — Discharge Summary (Signed)
NAME:  Carla [name], Jungman                         ACCOUNT NO.:  [id number]   MEDICAL RECORD NO.:  [id number]                   PATIENT TYPE:  IPS   LOCATION:  0304                                 FACILITY:  [facility]   PHYSICIAN:  Jeanice Lim, M.D.              DATE OF BIRTH:  1951/05/19   DATE OF ADMISSION:  12/26/2003  DATE OF DISCHARGE:  12/30/2003                                 DISCHARGE SUMMARY   IDENTIFYING DATA:  This is a 60 year old Caucasian female, married,  voluntarily admitted, overdosed.  Presented to the emergency room after  taking a handful of Sonata on Monday as an intentional overdose.  Wrote a  suicide note to family.  Had 2-3 glasses of wine.  Alcohol use has escalated  to daily since loss of job in April.  Multiple losses in the preceding five  years, two friends committed suicide, three weeks of worsening of mood,  anxiety and panic.  In the past, had been on Serzone, Zoloft, Effexor.  [facility] hospitalization to [facility].  History of depression since 1999.  No  previous suicide attempts.   MEDICATIONS:  Effexor, Prevacid, Allegra, Lasix, Benicar, Singulair and  Ditropan.   ALLERGIES:  LATEX, BETADINE and TAPE.   PHYSICAL EXAMINATION:  Physical exam and neurological exam within normal  limits.   LABORATORY DATA:  Routine admission labs within normal limits.   MENTAL STATUS EXAM:  Fully alert, spontaneous, tearful, cooperative,  pleasant, polite.  Speech within normal limits.  Mood depressed.  Affect  restricted.  Thought processes goal directed.  Positive suicidal ideation.  Cognitively intact.  Judgment and insight fair.   ADMISSION DIAGNOSES:   AXIS I:  1. Major depressive disorder, recurrent, severe.  2. Rule out alcohol abuse versus dependence.   AXIS II:  Deferred.   AXIS III:  Hypertension.   AXIS IV:  Severe (issues related to grief and loss, occupational problems).   AXIS V:  29/70.   HOSPITAL COURSE:  The patient was admitted and ordered routine  p.r.n.  medications and underwent further monitoring.  Was encouraged to participate  in individual, group and milieu therapy.  The patient was optimized on  Effexor.  She was advised to stop alcohol use and to seek substance abuse  treatment since this impacts her mood and was given Librium p.r.n. for  possible withdrawal symptoms.  The patient reported a positive improvement  in mood stability and admitted to increase want and use and was aware of the  impact of this on her mood and aware that this was an issue, although felt  that her depression was the primary issue and the many losses.  The patient  continued to improve.   CONDITION ON DISCHARGE:  Markedly improved with a euthymic mood.  Affect  brighter.  No dangerous ideation.  No acute withdrawal symptoms.  Motivated  to remain abstinent and follow up with aftercare planning, which she  participated in.  She was given medication education.   DISCHARGE MEDICATIONS:  1. Effexor XR 150 mg q.a.m.  2. Protonix 40 mg q.a.m.  3. Lamictal 25 mg x 7 days and then 50 mg q.a.m.  4. Astelin nasal spray b.i.d.  5. Ambien 10 mg q.h.s. p.r.n.  6. Claritin 10 mg q.a.m.  7. Ditropan XL 10 mg q.a.m.  8. K-Dur 20 mEq q.a.m.  9. Lasix 20 mg q.a.m.   FOLLOWUP:  The patient was to follow up with [facility] on January 04, 2004 at 2 p.m. and January 10, 2004 at 9 a.m. with Marquis Buggy for  therapy.   DISCHARGE DIAGNOSES:   AXIS I:  1. Major depressive disorder, recurrent, severe.  2. Rule out alcohol abuse versus dependence.   AXIS II:  Deferred.   AXIS III:  Hypertension.   AXIS IV:  Severe (issues related to grief and loss, occupational problems).   AXIS V:  Global Assessment of Functioning on discharge 55.                                               Jeanice Lim, M.D.    JEM/MEDQ  D:  01/21/2004  T:  01/22/2004  Job:  [id number]

## 2010-10-21 ENCOUNTER — Other Ambulatory Visit: Payer: Self-pay | Admitting: Dermatology

## 2011-01-24 ENCOUNTER — Other Ambulatory Visit: Payer: Self-pay | Admitting: Obstetrics and Gynecology

## 2011-01-24 DIAGNOSIS — N6009 Solitary cyst of unspecified breast: Secondary | ICD-10-CM

## 2011-01-30 ENCOUNTER — Ambulatory Visit
Admission: RE | Admit: 2011-01-30 | Discharge: 2011-01-30 | Disposition: A | Discharge status: Home / Self Care | Payer: 59 | Source: Ambulatory Visit | Attending: Obstetrics and Gynecology | Admitting: Obstetrics and Gynecology

## 2011-01-30 DIAGNOSIS — N6009 Solitary cyst of unspecified breast: Secondary | ICD-10-CM

## 2011-08-18 ENCOUNTER — Telehealth: Payer: Self-pay | Admitting: Pulmonary Disease

## 2011-08-18 NOTE — Telephone Encounter (Signed)
Called # provided above but office closed at 5 pm - will call back tomorrow.

## 2011-08-19 NOTE — Telephone Encounter (Signed)
Spoke with [facility]. She states pt was recently seen there and was telling them that she does not use her CPAP anymore b/c she did not like her mask. She wants to try the moses appliance, which is what Dr. Toni Arthurs makes, it is a dental appliance to help tx OSA. I advised her that she needs to have the pt call here for appt to re establish, since she was last seen Dec 09. She verbalized understanding and states will have the pt call for appt. Nothing further needed.

## 2012-10-07 ENCOUNTER — Other Ambulatory Visit: Payer: Self-pay | Admitting: Family Medicine

## 2012-10-07 ENCOUNTER — Ambulatory Visit
Admission: RE | Admit: 2012-10-07 | Discharge: 2012-10-07 | Disposition: A | Discharge status: Home / Self Care | Payer: 59 | Source: Ambulatory Visit | Attending: Family Medicine | Admitting: Family Medicine

## 2012-10-07 DIAGNOSIS — M79609 Pain in unspecified limb: Secondary | ICD-10-CM

## 2013-07-07 ENCOUNTER — Encounter: Payer: Self-pay | Admitting: Pulmonary Disease

## 2013-07-07 ENCOUNTER — Encounter (INDEPENDENT_AMBULATORY_CARE_PROVIDER_SITE_OTHER): Payer: Self-pay

## 2013-07-07 ENCOUNTER — Ambulatory Visit (INDEPENDENT_AMBULATORY_CARE_PROVIDER_SITE_OTHER): Payer: 59 | Admitting: Pulmonary Disease

## 2013-07-07 VITALS — BP 116/72 | HR 76 | Ht 67.0 in | Wt 224.0 lb

## 2013-07-07 DIAGNOSIS — G4733 Obstructive sleep apnea (adult) (pediatric): Secondary | ICD-10-CM

## 2013-07-07 DIAGNOSIS — F458 Other somatoform disorders: Secondary | ICD-10-CM

## 2013-07-07 NOTE — Patient Instructions (Signed)
Will arrange for sleep study Will call to arrange for follow up after sleep study reviewed 

## 2013-07-07 NOTE — Progress Notes (Signed)
Chief Complaint  Patient presents with  . Sleep Consult    Re-establish Care, last seen 2009. Epworth Score: 10.    History of Present Illness: Carla [name] is a 63 y.o. female for evaluation of sleep problems.  I last saw Carla [name] in 2009.  At that time she was found to have moderate sleep apnea, and started on CPAP.  She had trouble with her mask and pressure settings.  As a result she has not used CPAP for years.  She did feel it helped her sleep quality when she was able to use CPAP.  She has noticed more trouble with daytime sleepiness.  She is falling asleep while at work.  She continues to snore, and wakes up feeling like she can't breath.  She has been told she stops breathing while asleep.  She therefore decide to have additional sleep medicine follow up.  She has a history of TMJ and bruxism, and does not think she could use an oral appliace.  Sonata not often  She goes to sleep at 1130 pm.  She falls asleep within seconds.  She wakes up two times to tend to her dog.  She gets out of bed at 6 am.  She feels tired in the morning, and more exhausted as the day goes on.  She has trouble getting out of bed in the morning, and is often lay to work as a result.  She denies morning headache.  She occasionally uses sonata to help her sleep.  She uses vyvanse to help her stay awake sometimes.  She will also drink two cups of coffee in the morning.  She denies sleep walking, sleep talking, or nightmares.  There is no history of restless legs.  She denies sleep hallucinations, sleep paralysis, or cataplexy.  The Epworth score is 10 out of 24.  Tests: PSG 01/22/08 >> AHI 16, SpO2 low 86%  Carla [name]  has a past medical history of Hypertension; Hyperlipidemia; and GERD (gastroesophageal reflux disease).  Hanya P Serpas  has past surgical history that includes Abdominal hysterectomy and Breast lumpectomy.  Prior to Admission medications   Medication Sig Start Date End Date Taking?  Authorizing Provider  albuterol (PROAIR HFA) 108 (90 BASE) MCG/ACT inhaler Inhale 2 puffs into the lungs every 6 (six) hours as needed for wheezing or shortness of breath.   Yes Historical Provider, MD  atorvastatin (LIPITOR) 20 MG tablet Take 1 tablet by mouth daily. 06/01/13  Yes Historical Provider, MD  BENICAR HCT 20-12.5 MG per tablet Take 1 tablet by mouth daily. 06/10/13  Yes Historical Provider, MD  ELESTRIN 0.52 MG/0.87 GM (0.06%) GEL Apply 1 application topically daily. 04/27/13  Yes Historical Provider, MD  furosemide (LASIX) 20 MG tablet Take 1 tablet by mouth daily. 06/01/13  Yes Historical Provider, MD  lamoTRIgine (LAMICTAL) 150 MG tablet Take 1 tablet by mouth daily. 06/03/13  Yes Historical Provider, MD  lisdexamfetamine (VYVANSE) 50 MG capsule Take 50 mg by mouth daily.   Yes Historical Provider, MD  pantoprazole (PROTONIX) 40 MG tablet Take 1 tablet by mouth daily. 05/19/13  Yes Historical Provider, MD  venlafaxine XR (EFFEXOR-XR) 150 MG 24 hr capsule Take 1 capsule by mouth daily. 06/10/13  Yes Historical Provider, MD  zaleplon (SONATA) 10 MG capsule Take 1 capsule by mouth daily. 06/03/13  Yes Historical Provider, MD    Allergies  Allergen Reactions  . Latex     Her family history includes Breast cancer in her maternal grandmother  and paternal grandmother.  She  reports that she has never smoked. She has never used smokeless tobacco. She reports that she does not drink alcohol or use illicit drugs.  Review of Systems  Constitutional: Negative for fever, chills, diaphoresis, activity change, appetite change, fatigue and unexpected weight change.  HENT: Negative for congestion, dental problem, ear discharge, ear pain, facial swelling, hearing loss, mouth sores, nosebleeds, postnasal drip, rhinorrhea, sinus pressure, sneezing, sore throat, tinnitus, trouble swallowing and voice change.   Eyes: Negative for photophobia, discharge, itching and visual disturbance.  Respiratory:  Negative for apnea, cough, choking, chest tightness, shortness of breath, wheezing and stridor.   Cardiovascular: Negative for chest pain, palpitations and leg swelling.  Gastrointestinal: Negative for nausea, vomiting, abdominal pain, constipation, blood in stool and abdominal distention.  Genitourinary: Negative for dysuria, urgency, frequency, hematuria, flank pain, decreased urine volume and difficulty urinating.  Musculoskeletal: Negative for arthralgias, back pain, gait problem, joint swelling, myalgias, neck pain and neck stiffness.  Skin: Negative for color change, pallor and rash.  Neurological: Negative for dizziness, tremors, seizures, syncope, speech difficulty, weakness, light-headedness, numbness and headaches.  Hematological: Negative for adenopathy. Does not bruise/bleed easily.  Psychiatric/Behavioral: Negative for confusion, sleep disturbance and agitation. The patient is not nervous/anxious.    Physical Exam:  General - No distress ENT - No sinus tenderness, no oral exudate, no LAN, no thyromegaly, TM clear, pupils equal/reactive, MP 3, high arched palate, scalloped tongue Cardiac - s1s2 regular, no murmur, pulses symmetric Chest - No wheeze/rales/dullness, good air entry, normal respiratory excursion Back - No focal tenderness Abd - Soft, non-tender, no organomegaly, + bowel sounds Ext - No edema Neuro - Normal strength, cranial nerves intact Skin - No rashes Psych - Normal mood, and behavior  Assessment/plan:  Chesley Mires, MD [place] 07/07/2013, 3:03 PM Pager:  [phone] After 3pm call: [phone]

## 2013-07-07 NOTE — Progress Notes (Deleted)
   Subjective:    Patient ID: Carla [name], female    DOB: 1951-05-14, 63 y.o.   MRN: [id number]  HPI    Review of Systems  Constitutional: Negative for fever, chills, diaphoresis, activity change, appetite change, fatigue and unexpected weight change.  HENT: Negative for congestion, dental problem, ear discharge, ear pain, facial swelling, hearing loss, mouth sores, nosebleeds, postnasal drip, rhinorrhea, sinus pressure, sneezing, sore throat, tinnitus, trouble swallowing and voice change.   Eyes: Negative for photophobia, discharge, itching and visual disturbance.  Respiratory: Negative for apnea, cough, choking, chest tightness, shortness of breath, wheezing and stridor.   Cardiovascular: Negative for chest pain, palpitations and leg swelling.  Gastrointestinal: Negative for nausea, vomiting, abdominal pain, constipation, blood in stool and abdominal distention.  Genitourinary: Negative for dysuria, urgency, frequency, hematuria, flank pain, decreased urine volume and difficulty urinating.  Musculoskeletal: Negative for arthralgias, back pain, gait problem, joint swelling, myalgias, neck pain and neck stiffness.  Skin: Negative for color change, pallor and rash.  Neurological: Negative for dizziness, tremors, seizures, syncope, speech difficulty, weakness, light-headedness, numbness and headaches.  Hematological: Negative for adenopathy. Does not bruise/bleed easily.  Psychiatric/Behavioral: Negative for confusion, sleep disturbance and agitation. The patient is not nervous/anxious.        Objective:   Physical Exam        Assessment & Plan:

## 2013-07-10 DIAGNOSIS — F458 Other somatoform disorders: Secondary | ICD-10-CM | POA: Insufficient documentation

## 2013-07-10 NOTE — Assessment & Plan Note (Signed)
Will further assess on sleep study.

## 2013-07-10 NOTE — Assessment & Plan Note (Signed)
She reports snoring, sleep disruption, witnessed apnea, and daytime sleepiness.  She has history of HTN and prior sleep study showing moderate sleep apnea.  She likely still has sleep apnea.  We discussed how sleep apnea can affect various health problems including risks for hypertension, cardiovascular disease, and diabetes.  We also discussed how sleep disruption can increase risks for accident, such as while driving.  Weight loss as a means of improving sleep apnea was also reviewed.  Additional treatment options discussed were CPAP therapy, oral appliance, and surgical intervention.  To further assess the status of her sleep disordered breathing will arrange for in lab sleep study.

## 2013-08-19 ENCOUNTER — Ambulatory Visit (HOSPITAL_BASED_OUTPATIENT_CLINIC_OR_DEPARTMENT_OTHER): Payer: 59 | Attending: Pulmonary Disease | Admitting: Radiology

## 2013-08-19 VITALS — Ht 67.0 in | Wt 224.0 lb

## 2013-08-19 DIAGNOSIS — G4733 Obstructive sleep apnea (adult) (pediatric): Secondary | ICD-10-CM

## 2013-08-19 DIAGNOSIS — R0609 Other forms of dyspnea: Secondary | ICD-10-CM | POA: Insufficient documentation

## 2013-08-19 DIAGNOSIS — R0989 Other specified symptoms and signs involving the circulatory and respiratory systems: Secondary | ICD-10-CM | POA: Insufficient documentation

## 2013-09-04 ENCOUNTER — Telehealth: Payer: Self-pay | Admitting: Pulmonary Disease

## 2013-09-04 DIAGNOSIS — G4733 Obstructive sleep apnea (adult) (pediatric): Secondary | ICD-10-CM

## 2013-09-04 NOTE — Telephone Encounter (Signed)
PSG 08/19/13 >> AHI 26.6, SaO2 low 74%.  CPAP 13 cm H2O >> AHI 2.9.  Will have my nurse inform pt that sleep study shows moderate sleep apnea.  Options are either 1) ROV first, or 2) arrange for CPAP 13 cm H2O with heated humidity with mask of choice, and then have ROV two months after CPAP set up.

## 2013-09-04 NOTE — Sleep Study (Signed)
[place]  NAME: Carla [name] DATE OF BIRTH:  September 07, 1950 MEDICAL RECORD NUMBER [id number]  LOCATION: [facility]  PHYSICIAN: Chesley Mires, M.D. DATE OF STUDY: 08/19/2013  SLEEP STUDY TYPE: Nocturnal Polysomnogram               REFERRING PHYSICIAN: Chesley Mires, MD  INDICATION FOR STUDY:  63 year old female with snoring, sleep disruption, and daytime sleepiness.  She has history of hypertension.  She is referred to the sleep lab for evaluation of hypersomnia with obstructive sleep apnea.  EPWORTH SLEEPINESS SCORE: 6. HEIGHT: 5\' 7"  (170.2 cm)  WEIGHT: 101.606 kg (224 lb)    Body mass index is 35.08 kg/(m^2).  NECK SIZE: 17 in.  MEDICATIONS:  Current Outpatient Prescriptions on File Prior to Visit  Medication Sig Dispense Refill  . albuterol (PROAIR HFA) 108 (90 BASE) MCG/ACT inhaler Inhale 2 puffs into the lungs every 6 (six) hours as needed for wheezing or shortness of breath.      Marland Kitchen atorvastatin (LIPITOR) 20 MG tablet Take 1 tablet by mouth daily.      Marland Kitchen BENICAR HCT 20-12.5 MG per tablet Take 1 tablet by mouth daily.      Marland Kitchen ELESTRIN 0.52 MG/0.87 GM (0.06%) GEL Apply 1 application topically daily.      . furosemide (LASIX) 20 MG tablet Take 1 tablet by mouth daily.      Marland Kitchen lamoTRIgine (LAMICTAL) 150 MG tablet Take 1 tablet by mouth daily.      Marland Kitchen lisdexamfetamine (VYVANSE) 50 MG capsule Take 50 mg by mouth daily.      . pantoprazole (PROTONIX) 40 MG tablet Take 1 tablet by mouth daily.      Marland Kitchen venlafaxine XR (EFFEXOR-XR) 150 MG 24 hr capsule Take 1 capsule by mouth daily.      . zaleplon (SONATA) 10 MG capsule Take 1 capsule by mouth daily.       No current facility-administered medications on file prior to visit.    SLEEP ARCHITECTURE:  She followed a split night protocol.  Diagnostic portion: Total recording time: 136 minutes.  Total sleep time was: 122 minutes.  Sleep efficiency: 89.7%.  Sleep latency: 12 minutes.  REM latency: 80  minutes.  Stage N1: 5%.  Stage N2: 80.3%.  Stage N3: 0%.  Stage R:  15.6%.  Supine sleep: 31 minutes.  Non-supine sleep: 91 minutes.  Titration portion: Total recording time: 249.5 minutes.  Total sleep time was: 234.5 minutes.  Sleep efficiency: 94%.  Sleep latency: 1 minutes.  REM latency: 65 minutes.  Stage N1: 2.1%.  Stage N2: 52.9%.  Stage N3: 0%.  Stage R:  45%.  Supine sleep: 29.5 minutes.  Non-supine sleep: 185 minutes.  RESPIRATORY DATA: Diagnostic portion: Average respiratory rate: 18. Snoring: Moderate. Average AHI: 26.6.   Apnea index: 9.8.  Hypopnea index: 16.7. Obstructive apnea index: 8.9.  Central apnea index: 1.  Mixed apnea index: 0. REM AHI: 63.2.  NREM AHI: 19.8. Supine AHI: 75.5. Non-supine AHI: 9.9.  Titration portion: She was started on CPAP 5 and increase to 18 cm H2O.  With CPAP 13 cm H2O her AHI was reduced to 2.9.  OXYGEN DATA:  Baseline oxygenation: 93%. Lowest SaO2: 74%. Time spent below SaO2 90%: 39.1 minutes. Supplemental oxygen used: None.  CARDIAC DATA:  Average heart rate: 70 beats per minute. Rhythm strip: Sinus rhythm.  MOVEMENT/PARASOMNIA:  Periodic limb movement: 23.1.  Period limb movements with arousals: 2. Restroom trips: One.  IMPRESSION/ RECOMMENDATION:   This study shows moderate obstructive sleep apnea with an AHI of 26.6 and SaO2 low of 74%.  She did well with CPAP 13 cm H2O.  She was fitted with a small size Resmed airfit F10 mask.   Chesley Mires, M.D. Diplomate, [employer] of [employer] Medicine  ELECTRONICALLY SIGNED ON:  09/04/2013, 5:10 PM [place] PH: [phone]   FX: [phone] [place]

## 2013-09-05 NOTE — Telephone Encounter (Signed)
Pt is aware of results. Would like to go ahead and start with CPAP. Order has been placed. Recall will be placed for 2 month ROV.

## 2013-09-15 ENCOUNTER — Telehealth: Payer: Self-pay | Admitting: Pulmonary Disease

## 2013-09-15 DIAGNOSIS — G4733 Obstructive sleep apnea (adult) (pediatric): Secondary | ICD-10-CM

## 2013-09-15 NOTE — Telephone Encounter (Signed)
Spoke with pt, she wishes to change from [facility] to [facility].  [facility] is closer to her and she has received dme through them in the past.  This is for her cpap only, she does not use any supplemental 02.  Will put an order in to change dme provider and forward to pcc.  Nothing further needed at this time.

## 2013-09-21 ENCOUNTER — Telehealth: Payer: Self-pay | Admitting: Pulmonary Disease

## 2013-09-21 NOTE — Telephone Encounter (Signed)
I spoke with [facility] understands that patient did not receive any supplies or CPAP machine from [facility](confirmed this with patient via phone call) and [facility] can use the original order to get patient a newer CPAP machine(Pt stated that she had an old one but has not used it years).   Nothing more needed at this time.

## 2013-09-28 ENCOUNTER — Telehealth: Payer: Self-pay | Admitting: Pulmonary Disease

## 2013-09-28 DIAGNOSIS — G4733 Obstructive sleep apnea (adult) (pediatric): Secondary | ICD-10-CM

## 2013-09-28 NOTE — Telephone Encounter (Signed)
Order placed to [facility] to get to [facility].

## 2013-10-06 ENCOUNTER — Telehealth: Payer: Self-pay | Admitting: Pulmonary Disease

## 2013-10-06 NOTE — Telephone Encounter (Signed)
Spoke with Melissa. Pt received CPAP from [facility] in 2009 and [facility] paid for it (pt still has this insurance). She reports all insurance requires now a repair estimate has to be shown the machine not working before they will replace this. Also if pt has not used machine x 4 years and nothing wrong with it to send for repairs. They can change pressure and send supplies. Per Lenna Sciara pt aware of all this and was understanding. Will make VS aware as well as an [place]

## 2013-10-06 NOTE — Telephone Encounter (Signed)
Noted  

## 2013-11-21 ENCOUNTER — Ambulatory Visit (INDEPENDENT_AMBULATORY_CARE_PROVIDER_SITE_OTHER): Payer: 59 | Admitting: Pulmonary Disease

## 2013-11-21 ENCOUNTER — Encounter: Payer: Self-pay | Admitting: Pulmonary Disease

## 2013-11-21 ENCOUNTER — Encounter (INDEPENDENT_AMBULATORY_CARE_PROVIDER_SITE_OTHER): Payer: Self-pay

## 2013-11-21 VITALS — BP 118/72 | HR 74 | Temp 98.1°F | Ht 67.0 in | Wt 227.4 lb

## 2013-11-21 DIAGNOSIS — G4733 Obstructive sleep apnea (adult) (pediatric): Secondary | ICD-10-CM

## 2013-11-21 NOTE — Patient Instructions (Signed)
Follow up in 1 year.

## 2013-11-21 NOTE — Progress Notes (Signed)
Chief Complaint  Patient presents with  . Follow-up    Pt wears CPAP most nights. Pt reports that she ranges 3-4 hours on machine d/t sleeping from 2a-6a. Denies problems with mask / pressure.     History of Present Illness: Carla [name] is a 63 y.o. female with OSA.  She has been using her CPAP.  She has nasal mask, and this fits well.  She sleeps better when she uses her CPAP.  She is very busy taking care of her mother.  Sometimes she will fall asleep on the couch, and not have her CPAP on then.  She will put her mask on once she goes to bed.  She has not noticed clenching her teeth at night since starting CPAP.  TESTS: PSG 01/22/08 >> AHI 16, SpO2 low 86% PSG 08/19/13 >> AHI 26.6, SaO2 low 74%.  CPAP 13 cm H2O >> AHI 2.9.  Carla [name]  has a past medical history of Hypertension; Hyperlipidemia; and GERD (gastroesophageal reflux disease).  Carla [name]  has past surgical history that includes Abdominal hysterectomy and Breast lumpectomy.  Prior to Admission medications   Medication Sig Start Date End Date Taking? Authorizing Provider  albuterol (PROAIR HFA) 108 (90 BASE) MCG/ACT inhaler Inhale 2 puffs into the lungs every 6 (six) hours as needed for wheezing or shortness of breath.   Yes Historical Provider, MD  atorvastatin (LIPITOR) 20 MG tablet Take 1 tablet by mouth daily. 06/01/13  Yes Historical Provider, MD  BENICAR HCT 20-12.5 MG per tablet Take 1 tablet by mouth daily. 06/10/13  Yes Historical Provider, MD  furosemide (LASIX) 20 MG tablet Take 1 tablet by mouth daily. 06/01/13  Yes Historical Provider, MD  lamoTRIgine (LAMICTAL) 150 MG tablet Take 1 tablet by mouth daily. 06/03/13  Yes Historical Provider, MD  lisdexamfetamine (VYVANSE) 50 MG capsule Take 50 mg by mouth daily.   Yes Historical Provider, MD  pantoprazole (PROTONIX) 40 MG tablet Take 1 tablet by mouth daily. 05/19/13  Yes Historical Provider, MD  venlafaxine XR (EFFEXOR-XR) 150 MG 24 hr capsule Take 1  capsule by mouth daily. 06/10/13  Yes Historical Provider, MD  zaleplon (SONATA) 10 MG capsule Take 1 capsule by mouth daily. 06/03/13  Yes Historical Provider, MD    Allergies  Allergen Reactions  . Latex      Physical Exam:  General - No distress ENT - No sinus tenderness, no oral exudate, no LAN, MP 3, high arched palate, scalloped tongue Cardiac - s1s2 regular, no murmur Chest - No wheeze/rales/dullness Back - No focal tenderness Abd - Soft, non-tender Ext - No edema Neuro - Normal strength Skin - No rashes Psych - normal mood, and behavior   Assessment/Plan:  Carla Mires, MD [place] [facility]/Critical Care/Sleep Pager:  [phone]

## 2013-11-22 NOTE — Assessment & Plan Note (Signed)
She has moderate sleep apnea.  I have reviewed the recent sleep study results with the patient.  We discussed how sleep apnea can affect various health problems including risks for hypertension, cardiovascular disease, and diabetes.  We also discussed how sleep disruption can increase risks for accident, such as while driving.  Weight loss as a means of improving sleep apnea was also reviewed.  Additional treatment options discussed were CPAP therapy, oral appliance, and surgical intervention.  She has done well with restarting CPAP.  She is compliant with therapy and reports benefit.  Will get her CPAP download and call her with results.

## 2013-11-29 ENCOUNTER — Telehealth: Payer: Self-pay | Admitting: Pulmonary Disease

## 2013-11-29 NOTE — Telephone Encounter (Signed)
CPAP 10/19/13 to 11/17/13 >> Used on 30 of 30 nights with average 4 hrs 19 min.  Average AHI 5 with CPAP 13 cm H2O.  Will have my nurse inform pt that CPAP report looks good.  No change to current set up needed.

## 2013-12-05 ENCOUNTER — Other Ambulatory Visit: Payer: Self-pay | Admitting: Family Medicine

## 2013-12-05 DIAGNOSIS — M79604 Pain in right leg: Secondary | ICD-10-CM

## 2013-12-05 DIAGNOSIS — M79605 Pain in left leg: Principal | ICD-10-CM

## 2013-12-05 NOTE — Telephone Encounter (Signed)
Results have been explained to patient, pt expressed understanding. Nothing further needed.  

## 2013-12-06 ENCOUNTER — Ambulatory Visit
Admission: RE | Admit: 2013-12-06 | Discharge: 2013-12-06 | Disposition: A | Discharge status: Home / Self Care | Payer: 59 | Source: Ambulatory Visit | Attending: Family Medicine | Admitting: Family Medicine

## 2013-12-06 DIAGNOSIS — M79605 Pain in left leg: Principal | ICD-10-CM

## 2013-12-06 DIAGNOSIS — M79604 Pain in right leg: Secondary | ICD-10-CM

## 2014-04-18 ENCOUNTER — Other Ambulatory Visit: Payer: Self-pay | Admitting: Obstetrics and Gynecology

## 2014-04-20 LAB — CYTOLOGY - PAP

## 2014-09-07 IMAGING — CR DG HAND 2V*R*
2 series · 2 of 2 positions shown · non-contrast
Comparison: None.

CLINICAL DATA: Increasing pain with limited range of motion.

RIGHT HAND - 2 VIEW

[view not recorded (1 of 2)]
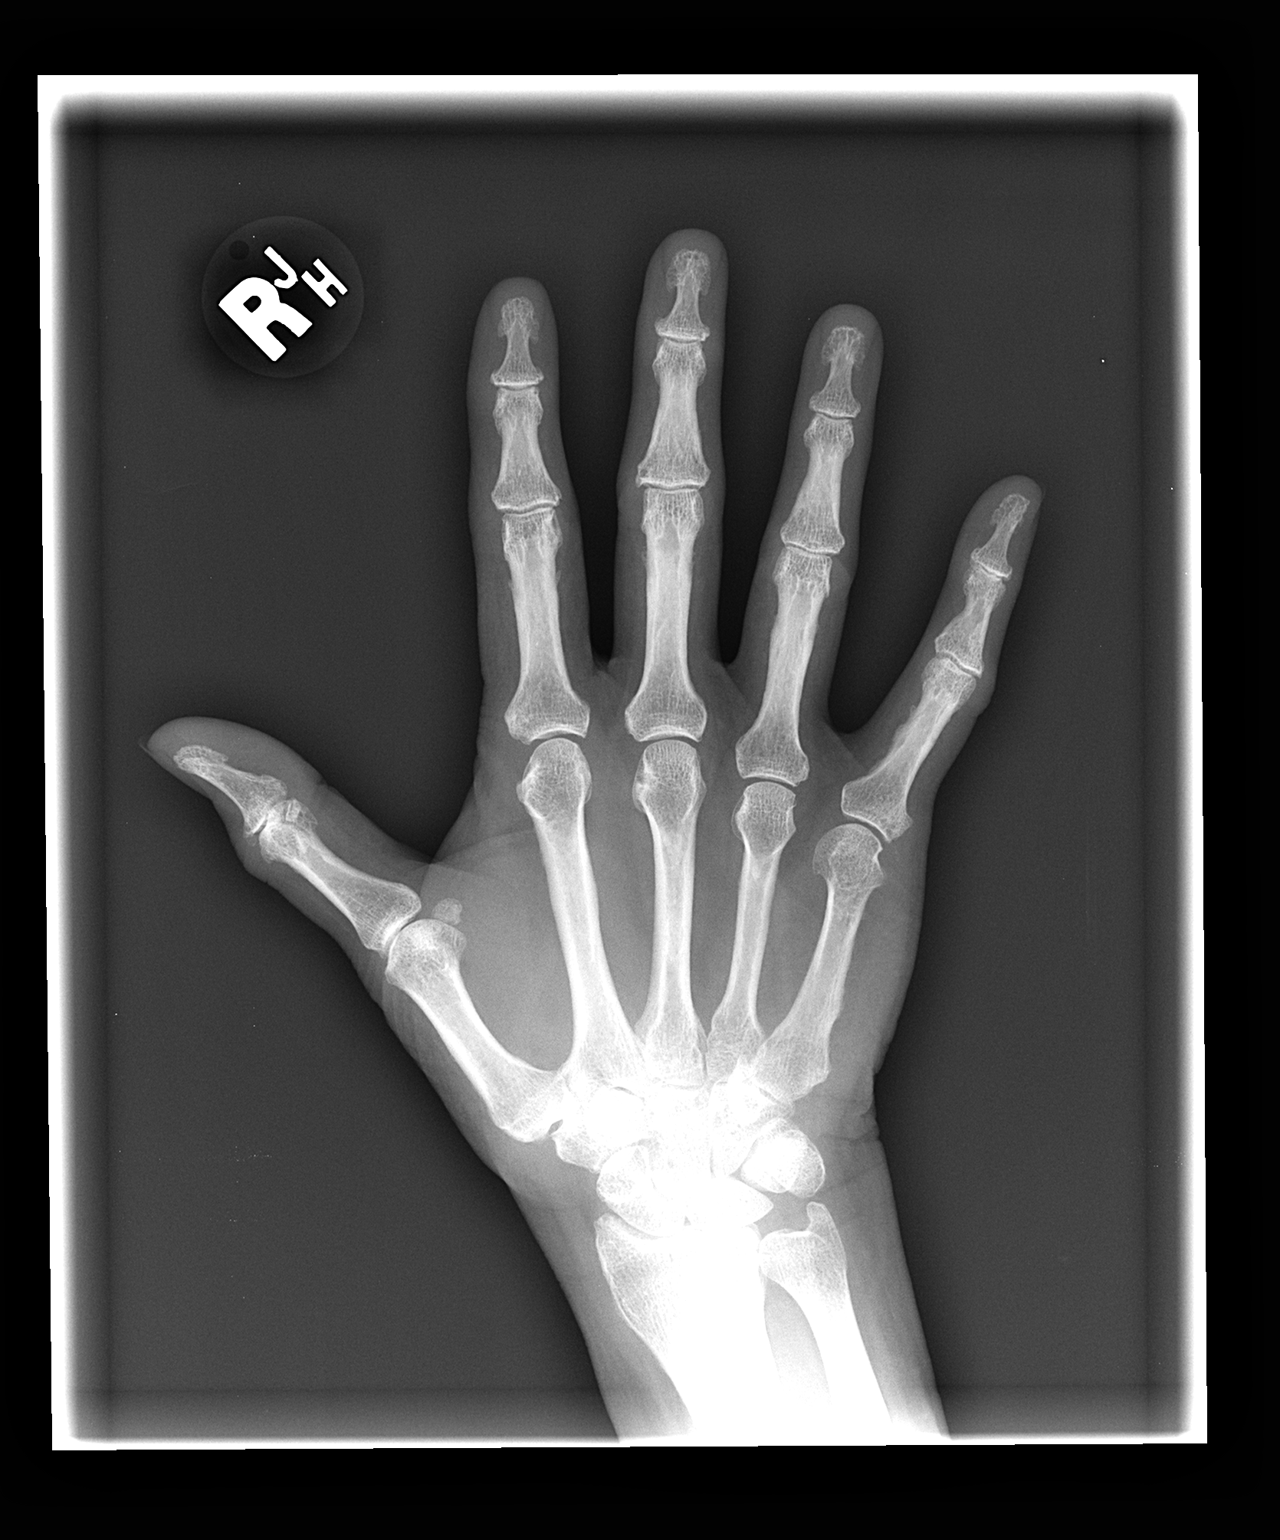

[view not recorded (2 of 2)]
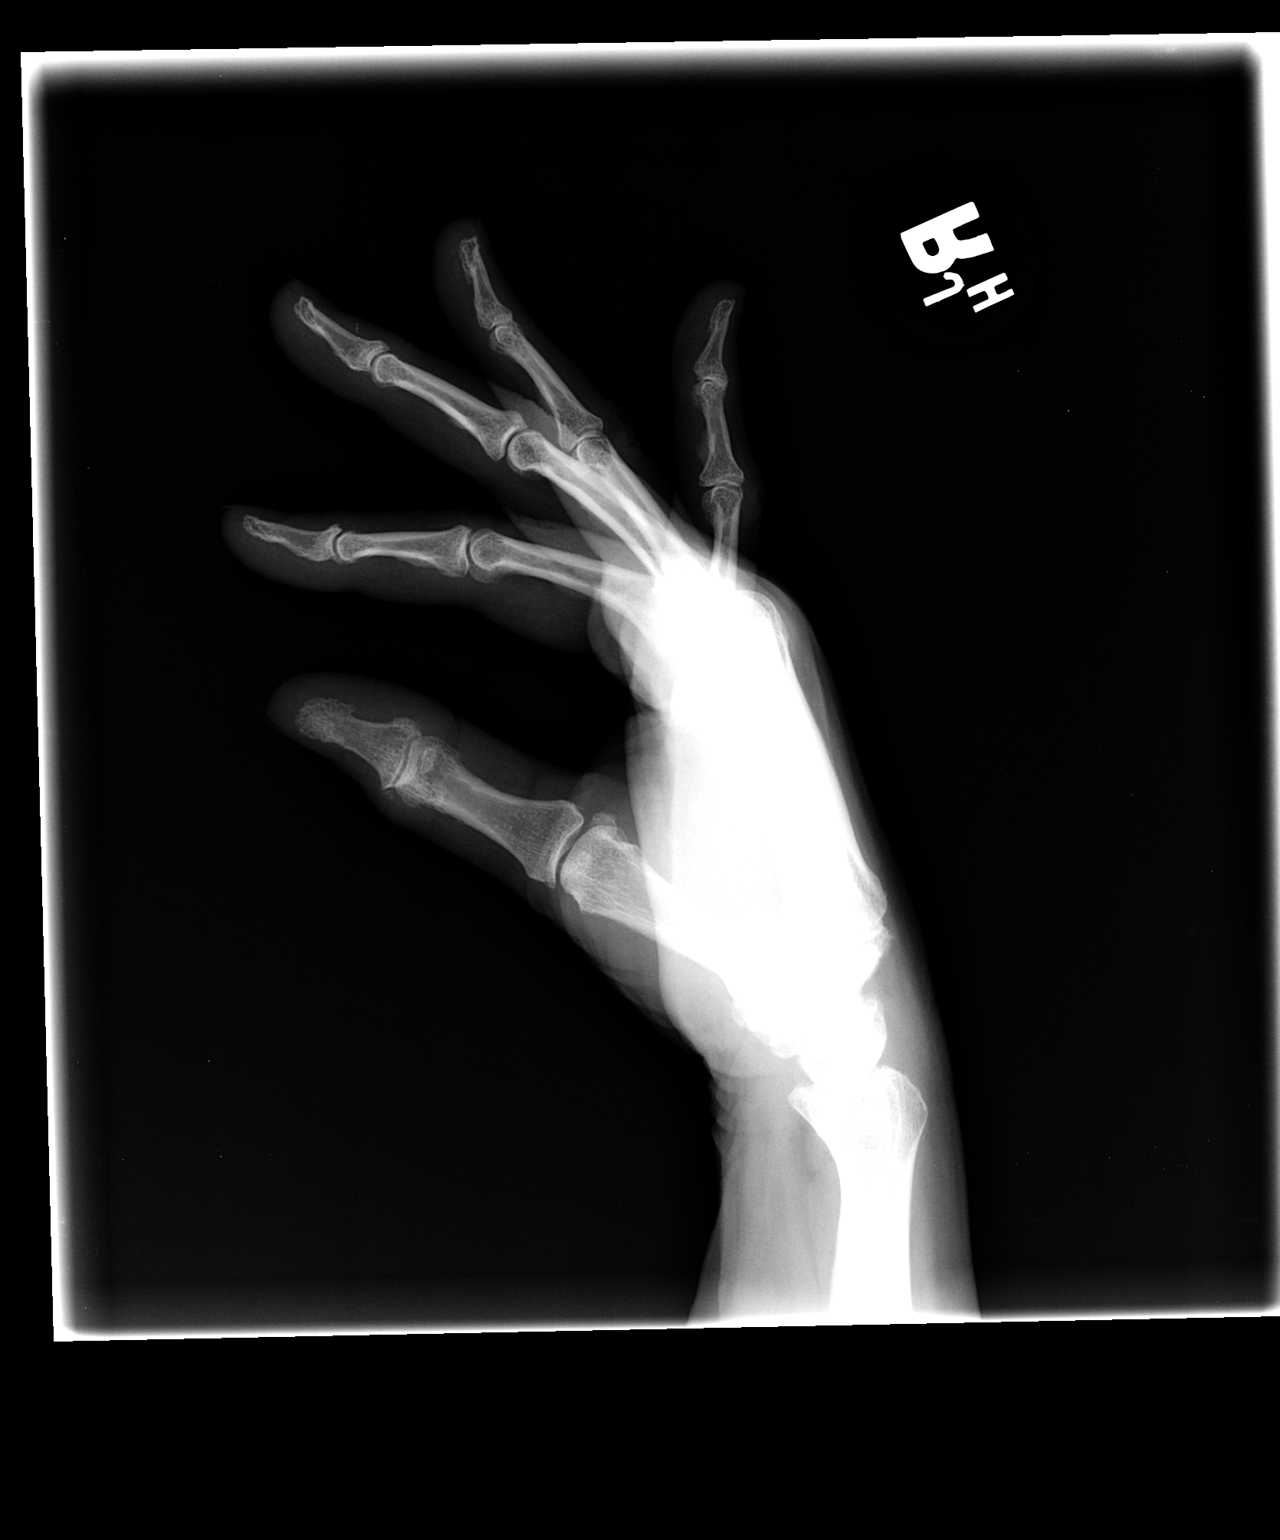

[2 of 2 positions shown; findings below may reference images not displayed]

FINDINGS: No acute osseous or joint abnormality.  Mild degenerative
change in the distal interphalangeal joints and interphalangeal
joint of the thumb.
IMPRESSION: 1.  No acute findings.
2.  Distal interphalangeal joint osteoarthritis.

## 2015-11-06 IMAGING — US US EXTREM LOW VENOUS BILAT
1 series · 14 of 24 positions shown · non-contrast
Comparison: None.

CLINICAL DATA: Bilateral leg pain

EXAM:
BOWEL LOWER EXTREMITY VENOUS DUPLEX ULTRASOUND
TECHNIQUE: Doppler venous assessment of the bilateral lower extremity deep
venous system was performed, including characterization of spectral
flow, compressibility, and phasicity.

[Series 1: us extrem low venous bilat · 14 of 58 slices shown]
[im 1/58]
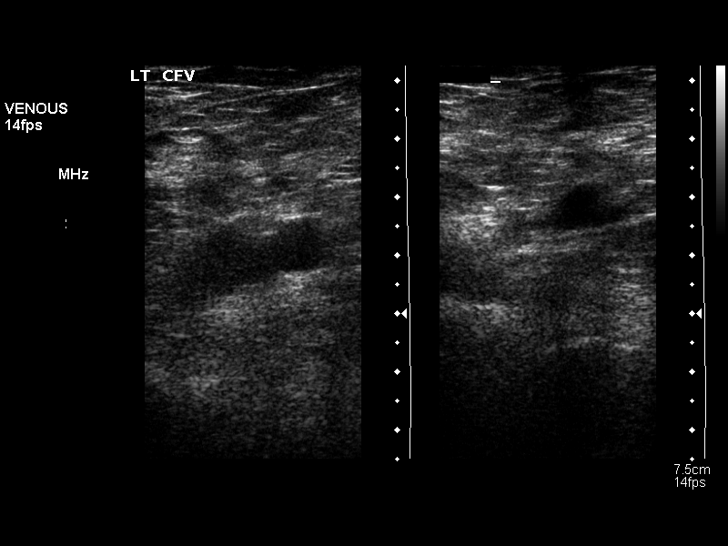
[im 5/58]
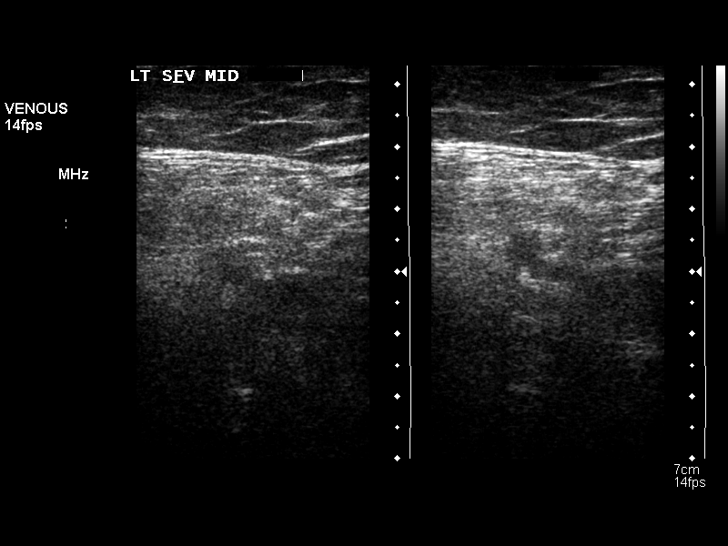
[im 10/58]
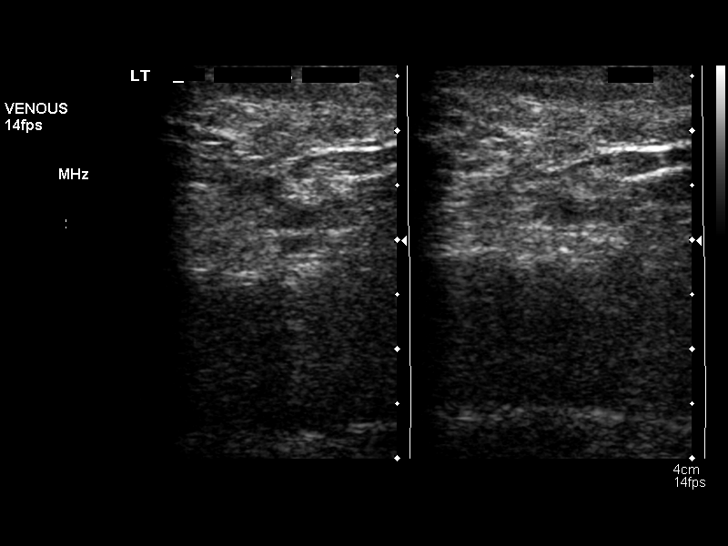
[im 15/58]
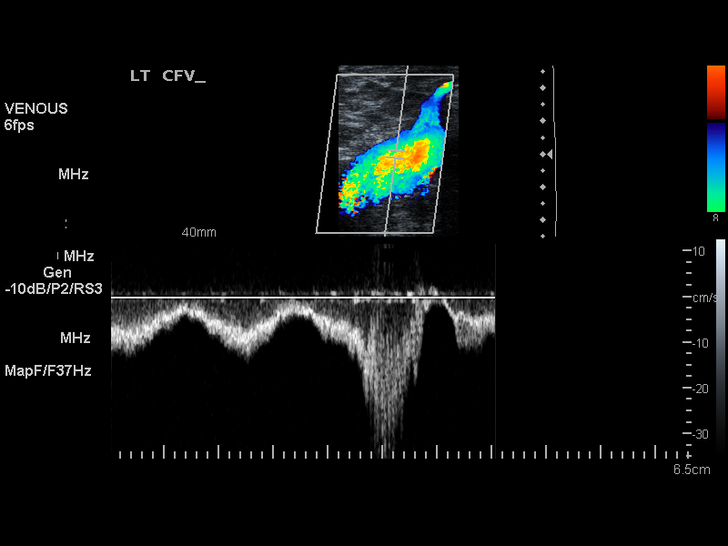
[im 18/58]
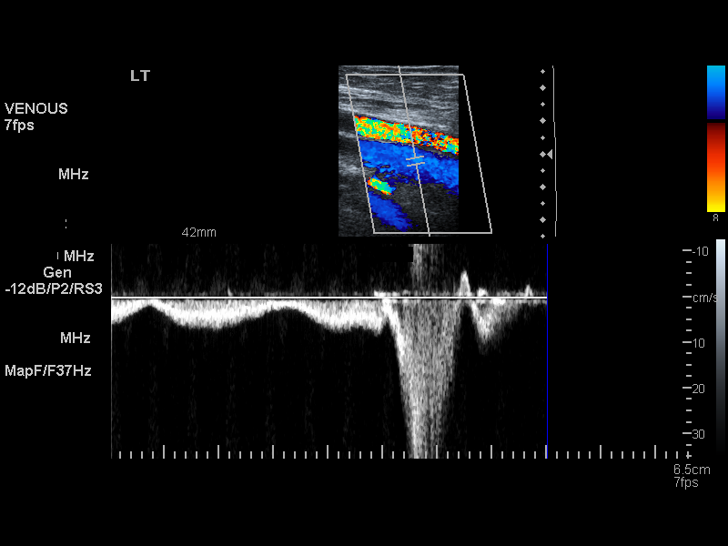
[im 23/58]
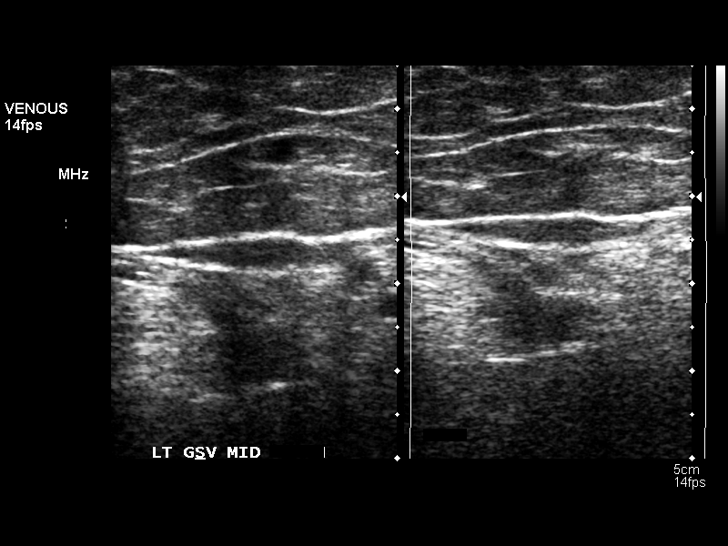
[im 28/58]
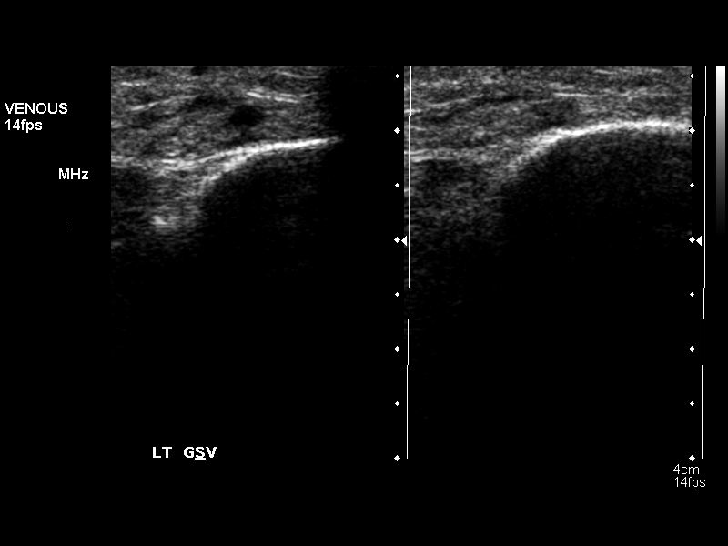
[im 30/58]
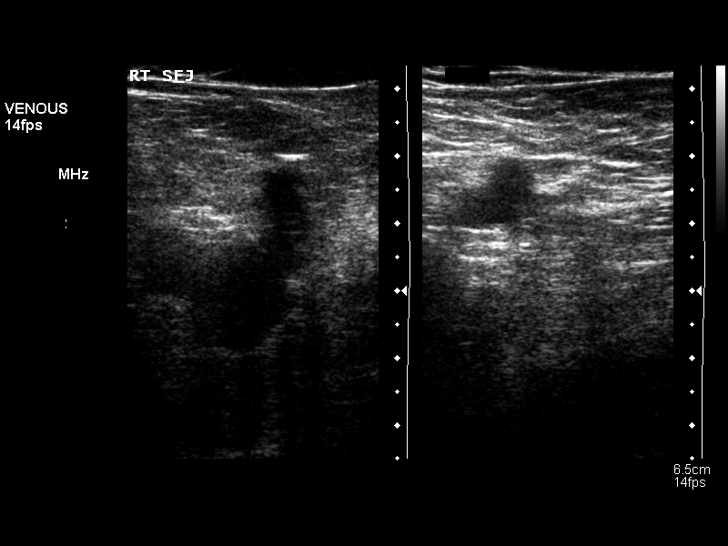
[im 35/58]
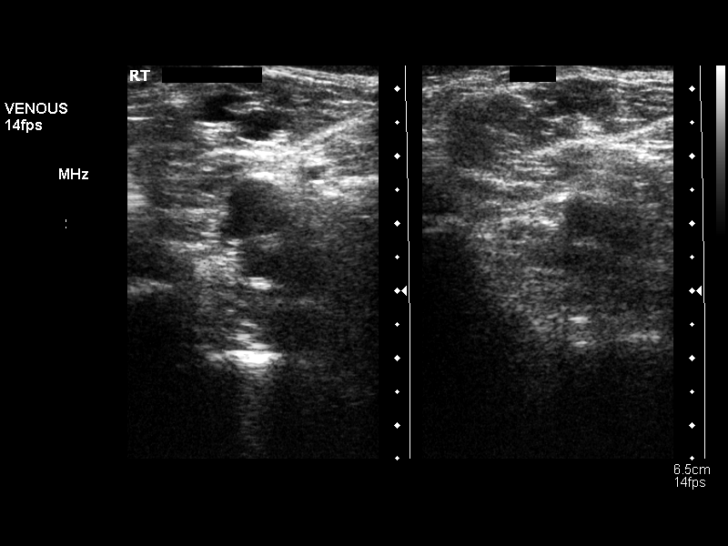
[im 40/58]
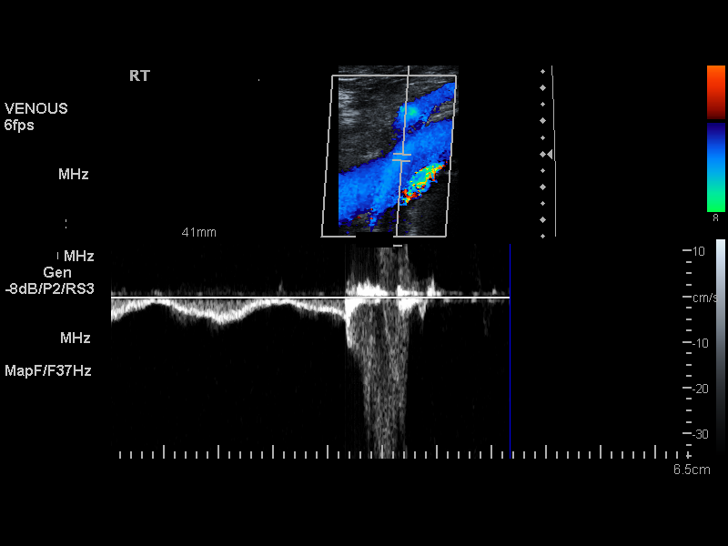
[im 45/58]
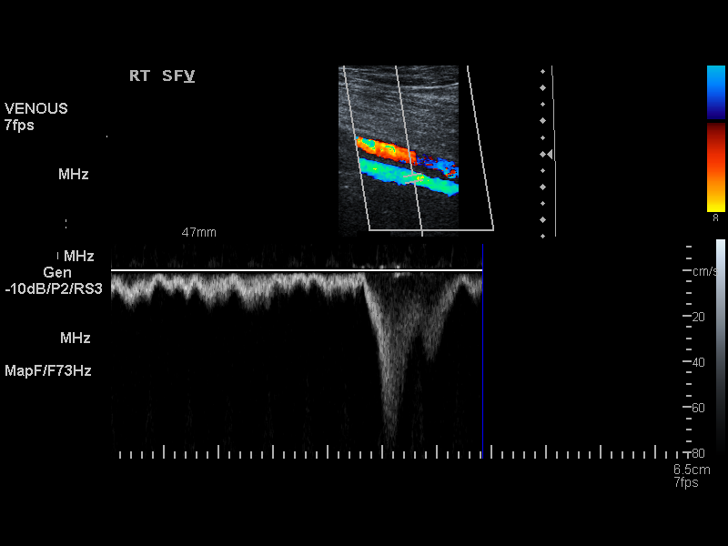
[im 48/58]
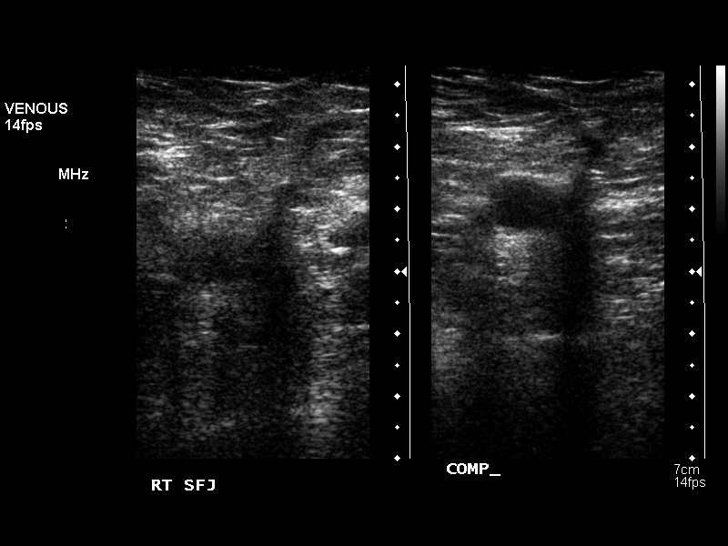
[im 53/58]
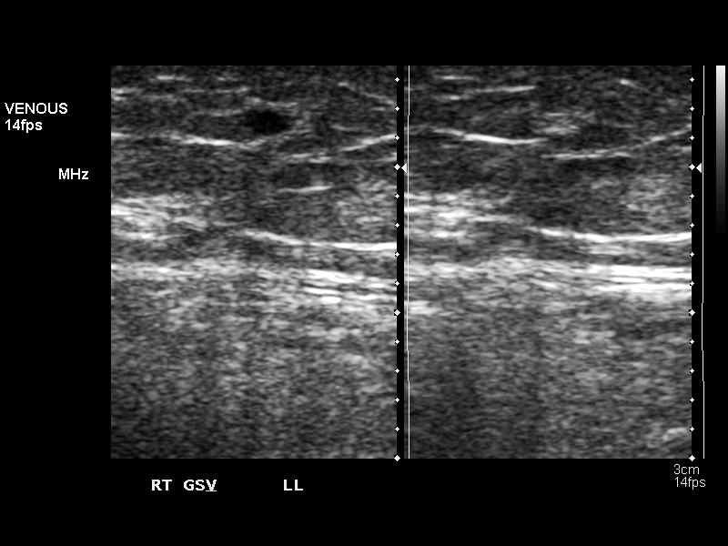
[im 58/58]
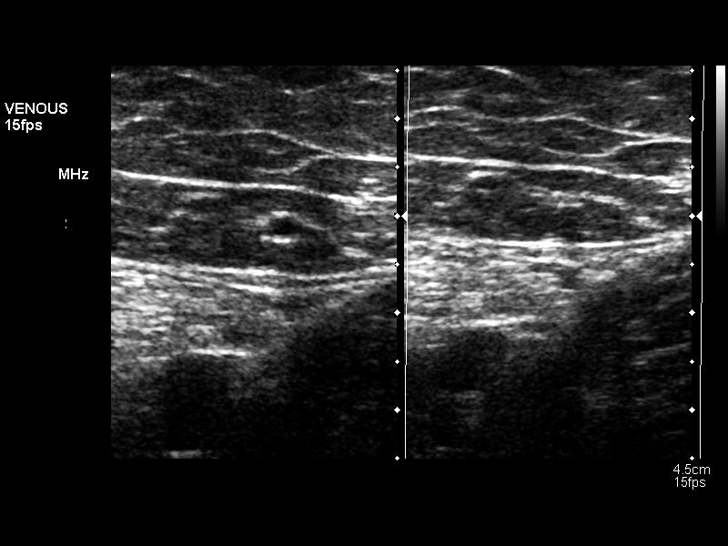

[14 of 24 positions shown; findings below may reference images not displayed]

FINDINGS: There is complete compressibility of the bilateral common femoral,
femoral, and popliteal veins. Doppler analysis demonstrates
respiratory phasicity and augmentation of flow upon calf
compression. No obvious superficial vein or calf vein thrombosis.
IMPRESSION: No evidence of lower extremity DVT

## 2018-01-22 DIAGNOSIS — H401132 Primary open-angle glaucoma, bilateral, moderate stage: Secondary | ICD-10-CM | POA: Diagnosis not present

## 2018-02-09 DIAGNOSIS — K219 Gastro-esophageal reflux disease without esophagitis: Secondary | ICD-10-CM | POA: Diagnosis not present

## 2018-02-09 DIAGNOSIS — J4520 Mild intermittent asthma, uncomplicated: Secondary | ICD-10-CM | POA: Diagnosis not present

## 2018-02-09 DIAGNOSIS — E1165 Type 2 diabetes mellitus with hyperglycemia: Secondary | ICD-10-CM | POA: Diagnosis not present

## 2018-02-09 DIAGNOSIS — Z23 Encounter for immunization: Secondary | ICD-10-CM | POA: Diagnosis not present

## 2018-02-09 DIAGNOSIS — I10 Essential (primary) hypertension: Secondary | ICD-10-CM | POA: Diagnosis not present

## 2018-02-09 DIAGNOSIS — Z0000 Encounter for general adult medical examination without abnormal findings: Secondary | ICD-10-CM | POA: Diagnosis not present

## 2018-02-09 DIAGNOSIS — E7800 Pure hypercholesterolemia, unspecified: Secondary | ICD-10-CM | POA: Diagnosis not present

## 2018-02-09 DIAGNOSIS — Z1211 Encounter for screening for malignant neoplasm of colon: Secondary | ICD-10-CM | POA: Diagnosis not present

## 2018-02-09 DIAGNOSIS — F324 Major depressive disorder, single episode, in partial remission: Secondary | ICD-10-CM | POA: Diagnosis not present

## 2018-02-09 DIAGNOSIS — F5101 Primary insomnia: Secondary | ICD-10-CM | POA: Diagnosis not present

## 2018-02-16 DIAGNOSIS — Z803 Family history of malignant neoplasm of breast: Secondary | ICD-10-CM | POA: Diagnosis not present

## 2018-05-11 DIAGNOSIS — N183 Chronic kidney disease, stage 3 (moderate): Secondary | ICD-10-CM | POA: Diagnosis not present

## 2018-05-11 DIAGNOSIS — F900 Attention-deficit hyperactivity disorder, predominantly inattentive type: Secondary | ICD-10-CM | POA: Diagnosis not present

## 2018-05-11 DIAGNOSIS — E7800 Pure hypercholesterolemia, unspecified: Secondary | ICD-10-CM | POA: Diagnosis not present

## 2018-05-11 DIAGNOSIS — E119 Type 2 diabetes mellitus without complications: Secondary | ICD-10-CM | POA: Diagnosis not present

## 2018-05-11 DIAGNOSIS — Z1211 Encounter for screening for malignant neoplasm of colon: Secondary | ICD-10-CM | POA: Diagnosis not present

## 2018-05-11 DIAGNOSIS — F324 Major depressive disorder, single episode, in partial remission: Secondary | ICD-10-CM | POA: Diagnosis not present

## 2018-05-11 DIAGNOSIS — I10 Essential (primary) hypertension: Secondary | ICD-10-CM | POA: Diagnosis not present

## 2018-08-18 DIAGNOSIS — G4733 Obstructive sleep apnea (adult) (pediatric): Secondary | ICD-10-CM | POA: Diagnosis not present

## 2018-08-24 DIAGNOSIS — E7800 Pure hypercholesterolemia, unspecified: Secondary | ICD-10-CM | POA: Diagnosis not present

## 2018-08-24 DIAGNOSIS — F324 Major depressive disorder, single episode, in partial remission: Secondary | ICD-10-CM | POA: Diagnosis not present

## 2018-08-24 DIAGNOSIS — E1165 Type 2 diabetes mellitus with hyperglycemia: Secondary | ICD-10-CM | POA: Diagnosis not present

## 2018-08-24 DIAGNOSIS — I10 Essential (primary) hypertension: Secondary | ICD-10-CM | POA: Diagnosis not present

## 2018-08-24 DIAGNOSIS — G4733 Obstructive sleep apnea (adult) (pediatric): Secondary | ICD-10-CM | POA: Diagnosis not present

## 2018-08-24 DIAGNOSIS — F5101 Primary insomnia: Secondary | ICD-10-CM | POA: Diagnosis not present

## 2018-08-24 DIAGNOSIS — F900 Attention-deficit hyperactivity disorder, predominantly inattentive type: Secondary | ICD-10-CM | POA: Diagnosis not present

## 2018-11-16 DIAGNOSIS — I10 Essential (primary) hypertension: Secondary | ICD-10-CM | POA: Diagnosis not present

## 2018-11-16 DIAGNOSIS — F324 Major depressive disorder, single episode, in partial remission: Secondary | ICD-10-CM | POA: Diagnosis not present

## 2018-11-16 DIAGNOSIS — E7800 Pure hypercholesterolemia, unspecified: Secondary | ICD-10-CM | POA: Diagnosis not present

## 2018-11-16 DIAGNOSIS — N183 Chronic kidney disease, stage 3 (moderate): Secondary | ICD-10-CM | POA: Diagnosis not present

## 2018-11-16 DIAGNOSIS — E1165 Type 2 diabetes mellitus with hyperglycemia: Secondary | ICD-10-CM | POA: Diagnosis not present

## 2018-11-16 DIAGNOSIS — F3181 Bipolar II disorder: Secondary | ICD-10-CM | POA: Diagnosis not present

## 2018-11-16 DIAGNOSIS — F900 Attention-deficit hyperactivity disorder, predominantly inattentive type: Secondary | ICD-10-CM | POA: Diagnosis not present

## 2018-11-22 DIAGNOSIS — G4733 Obstructive sleep apnea (adult) (pediatric): Secondary | ICD-10-CM | POA: Diagnosis not present

## 2018-11-25 DIAGNOSIS — E119 Type 2 diabetes mellitus without complications: Secondary | ICD-10-CM | POA: Diagnosis not present

## 2018-11-25 DIAGNOSIS — I10 Essential (primary) hypertension: Secondary | ICD-10-CM | POA: Diagnosis not present

## 2018-11-25 DIAGNOSIS — E1165 Type 2 diabetes mellitus with hyperglycemia: Secondary | ICD-10-CM | POA: Diagnosis not present

## 2018-11-25 DIAGNOSIS — F324 Major depressive disorder, single episode, in partial remission: Secondary | ICD-10-CM | POA: Diagnosis not present

## 2018-11-25 DIAGNOSIS — M1990 Unspecified osteoarthritis, unspecified site: Secondary | ICD-10-CM | POA: Diagnosis not present

## 2018-11-25 DIAGNOSIS — J4520 Mild intermittent asthma, uncomplicated: Secondary | ICD-10-CM | POA: Diagnosis not present

## 2018-11-25 DIAGNOSIS — F3181 Bipolar II disorder: Secondary | ICD-10-CM | POA: Diagnosis not present

## 2018-11-25 DIAGNOSIS — E7800 Pure hypercholesterolemia, unspecified: Secondary | ICD-10-CM | POA: Diagnosis not present

## 2018-11-25 DIAGNOSIS — N183 Chronic kidney disease, stage 3 (moderate): Secondary | ICD-10-CM | POA: Diagnosis not present

## 2018-12-28 DIAGNOSIS — E119 Type 2 diabetes mellitus without complications: Secondary | ICD-10-CM | POA: Diagnosis not present

## 2018-12-28 DIAGNOSIS — H401132 Primary open-angle glaucoma, bilateral, moderate stage: Secondary | ICD-10-CM | POA: Diagnosis not present

## 2018-12-28 DIAGNOSIS — H2513 Age-related nuclear cataract, bilateral: Secondary | ICD-10-CM | POA: Diagnosis not present

## 2018-12-28 DIAGNOSIS — H52203 Unspecified astigmatism, bilateral: Secondary | ICD-10-CM | POA: Diagnosis not present

## 2019-01-28 DIAGNOSIS — E1165 Type 2 diabetes mellitus with hyperglycemia: Secondary | ICD-10-CM | POA: Diagnosis not present

## 2019-01-28 DIAGNOSIS — N183 Chronic kidney disease, stage 3 (moderate): Secondary | ICD-10-CM | POA: Diagnosis not present

## 2019-01-28 DIAGNOSIS — F3181 Bipolar II disorder: Secondary | ICD-10-CM | POA: Diagnosis not present

## 2019-01-28 DIAGNOSIS — I10 Essential (primary) hypertension: Secondary | ICD-10-CM | POA: Diagnosis not present

## 2019-01-28 DIAGNOSIS — E119 Type 2 diabetes mellitus without complications: Secondary | ICD-10-CM | POA: Diagnosis not present

## 2019-01-28 DIAGNOSIS — F324 Major depressive disorder, single episode, in partial remission: Secondary | ICD-10-CM | POA: Diagnosis not present

## 2019-01-28 DIAGNOSIS — E7800 Pure hypercholesterolemia, unspecified: Secondary | ICD-10-CM | POA: Diagnosis not present

## 2019-01-28 DIAGNOSIS — J4520 Mild intermittent asthma, uncomplicated: Secondary | ICD-10-CM | POA: Diagnosis not present

## 2019-01-28 DIAGNOSIS — M1990 Unspecified osteoarthritis, unspecified site: Secondary | ICD-10-CM | POA: Diagnosis not present

## 2019-02-15 DIAGNOSIS — N183 Chronic kidney disease, stage 3 (moderate): Secondary | ICD-10-CM | POA: Diagnosis not present

## 2019-02-15 DIAGNOSIS — I10 Essential (primary) hypertension: Secondary | ICD-10-CM | POA: Diagnosis not present

## 2019-02-15 DIAGNOSIS — G4733 Obstructive sleep apnea (adult) (pediatric): Secondary | ICD-10-CM | POA: Diagnosis not present

## 2019-02-15 DIAGNOSIS — J4520 Mild intermittent asthma, uncomplicated: Secondary | ICD-10-CM | POA: Diagnosis not present

## 2019-02-15 DIAGNOSIS — L308 Other specified dermatitis: Secondary | ICD-10-CM | POA: Diagnosis not present

## 2019-02-15 DIAGNOSIS — F900 Attention-deficit hyperactivity disorder, predominantly inattentive type: Secondary | ICD-10-CM | POA: Diagnosis not present

## 2019-02-15 DIAGNOSIS — Z0000 Encounter for general adult medical examination without abnormal findings: Secondary | ICD-10-CM | POA: Diagnosis not present

## 2019-02-15 DIAGNOSIS — F5101 Primary insomnia: Secondary | ICD-10-CM | POA: Diagnosis not present

## 2019-02-15 DIAGNOSIS — K219 Gastro-esophageal reflux disease without esophagitis: Secondary | ICD-10-CM | POA: Diagnosis not present

## 2019-02-15 DIAGNOSIS — E7800 Pure hypercholesterolemia, unspecified: Secondary | ICD-10-CM | POA: Diagnosis not present

## 2019-02-15 DIAGNOSIS — E1165 Type 2 diabetes mellitus with hyperglycemia: Secondary | ICD-10-CM | POA: Diagnosis not present

## 2019-02-15 DIAGNOSIS — F324 Major depressive disorder, single episode, in partial remission: Secondary | ICD-10-CM | POA: Diagnosis not present

## 2019-04-25 DIAGNOSIS — I10 Essential (primary) hypertension: Secondary | ICD-10-CM | POA: Diagnosis not present

## 2019-04-25 DIAGNOSIS — J4520 Mild intermittent asthma, uncomplicated: Secondary | ICD-10-CM | POA: Diagnosis not present

## 2019-04-25 DIAGNOSIS — E7800 Pure hypercholesterolemia, unspecified: Secondary | ICD-10-CM | POA: Diagnosis not present

## 2019-04-25 DIAGNOSIS — F3181 Bipolar II disorder: Secondary | ICD-10-CM | POA: Diagnosis not present

## 2019-04-25 DIAGNOSIS — M1990 Unspecified osteoarthritis, unspecified site: Secondary | ICD-10-CM | POA: Diagnosis not present

## 2019-04-25 DIAGNOSIS — F324 Major depressive disorder, single episode, in partial remission: Secondary | ICD-10-CM | POA: Diagnosis not present

## 2019-04-25 DIAGNOSIS — E1165 Type 2 diabetes mellitus with hyperglycemia: Secondary | ICD-10-CM | POA: Diagnosis not present

## 2019-04-25 DIAGNOSIS — N183 Chronic kidney disease, stage 3 unspecified: Secondary | ICD-10-CM | POA: Diagnosis not present

## 2019-04-26 DIAGNOSIS — Z85820 Personal history of malignant melanoma of skin: Secondary | ICD-10-CM | POA: Diagnosis not present

## 2019-04-26 DIAGNOSIS — L700 Acne vulgaris: Secondary | ICD-10-CM | POA: Diagnosis not present

## 2019-04-26 DIAGNOSIS — L821 Other seborrheic keratosis: Secondary | ICD-10-CM | POA: Diagnosis not present

## 2019-04-26 DIAGNOSIS — D2271 Melanocytic nevi of right lower limb, including hip: Secondary | ICD-10-CM | POA: Diagnosis not present

## 2019-04-26 DIAGNOSIS — D225 Melanocytic nevi of trunk: Secondary | ICD-10-CM | POA: Diagnosis not present

## 2019-04-26 DIAGNOSIS — L738 Other specified follicular disorders: Secondary | ICD-10-CM | POA: Diagnosis not present

## 2019-05-03 DIAGNOSIS — Z23 Encounter for immunization: Secondary | ICD-10-CM | POA: Diagnosis not present

## 2019-06-28 DIAGNOSIS — L309 Dermatitis, unspecified: Secondary | ICD-10-CM | POA: Diagnosis not present

## 2019-06-28 DIAGNOSIS — L814 Other melanin hyperpigmentation: Secondary | ICD-10-CM | POA: Diagnosis not present

## 2019-06-28 DIAGNOSIS — Z85820 Personal history of malignant melanoma of skin: Secondary | ICD-10-CM | POA: Diagnosis not present

## 2019-06-28 DIAGNOSIS — L821 Other seborrheic keratosis: Secondary | ICD-10-CM | POA: Diagnosis not present

## 2019-06-28 DIAGNOSIS — D229 Melanocytic nevi, unspecified: Secondary | ICD-10-CM | POA: Diagnosis not present

## 2019-06-28 DIAGNOSIS — Z08 Encounter for follow-up examination after completed treatment for malignant neoplasm: Secondary | ICD-10-CM | POA: Diagnosis not present

## 2019-06-28 DIAGNOSIS — L98499 Non-pressure chronic ulcer of skin of other sites with unspecified severity: Secondary | ICD-10-CM | POA: Diagnosis not present

## 2019-07-12 DIAGNOSIS — L0100 Impetigo, unspecified: Secondary | ICD-10-CM | POA: Diagnosis not present

## 2019-07-27 DIAGNOSIS — E7800 Pure hypercholesterolemia, unspecified: Secondary | ICD-10-CM | POA: Diagnosis not present

## 2019-07-27 DIAGNOSIS — F3181 Bipolar II disorder: Secondary | ICD-10-CM | POA: Diagnosis not present

## 2019-07-27 DIAGNOSIS — E785 Hyperlipidemia, unspecified: Secondary | ICD-10-CM | POA: Diagnosis not present

## 2019-07-27 DIAGNOSIS — Z7984 Long term (current) use of oral hypoglycemic drugs: Secondary | ICD-10-CM | POA: Diagnosis not present

## 2019-07-27 DIAGNOSIS — M1990 Unspecified osteoarthritis, unspecified site: Secondary | ICD-10-CM | POA: Diagnosis not present

## 2019-07-27 DIAGNOSIS — J4520 Mild intermittent asthma, uncomplicated: Secondary | ICD-10-CM | POA: Diagnosis not present

## 2019-07-27 DIAGNOSIS — F324 Major depressive disorder, single episode, in partial remission: Secondary | ICD-10-CM | POA: Diagnosis not present

## 2019-07-27 DIAGNOSIS — I10 Essential (primary) hypertension: Secondary | ICD-10-CM | POA: Diagnosis not present

## 2019-07-27 DIAGNOSIS — E1165 Type 2 diabetes mellitus with hyperglycemia: Secondary | ICD-10-CM | POA: Diagnosis not present

## 2019-07-29 DIAGNOSIS — Z23 Encounter for immunization: Secondary | ICD-10-CM | POA: Diagnosis not present

## 2019-08-23 DIAGNOSIS — L0100 Impetigo, unspecified: Secondary | ICD-10-CM | POA: Diagnosis not present

## 2019-08-23 DIAGNOSIS — C44722 Squamous cell carcinoma of skin of right lower limb, including hip: Secondary | ICD-10-CM | POA: Diagnosis not present

## 2019-08-23 DIAGNOSIS — D485 Neoplasm of uncertain behavior of skin: Secondary | ICD-10-CM | POA: Diagnosis not present

## 2019-08-29 DIAGNOSIS — Z23 Encounter for immunization: Secondary | ICD-10-CM | POA: Diagnosis not present

## 2020-05-08 DIAGNOSIS — E119 Type 2 diabetes mellitus without complications: Secondary | ICD-10-CM | POA: Insufficient documentation

## 2020-05-08 DIAGNOSIS — F909 Attention-deficit hyperactivity disorder, unspecified type: Secondary | ICD-10-CM | POA: Insufficient documentation

## 2020-05-08 DIAGNOSIS — H409 Unspecified glaucoma: Secondary | ICD-10-CM | POA: Insufficient documentation

## 2021-07-09 LAB — HM DEXA SCAN

## 2021-09-10 DIAGNOSIS — F3181 Bipolar II disorder: Secondary | ICD-10-CM | POA: Insufficient documentation

## 2022-02-12 DIAGNOSIS — M1990 Unspecified osteoarthritis, unspecified site: Secondary | ICD-10-CM | POA: Insufficient documentation

## 2023-05-13 LAB — COLOGUARD: Cologuard: NEGATIVE

## 2023-05-20 LAB — COLOGUARD: COLOGUARD: NEGATIVE

## 2023-10-27 LAB — HEMOGLOBIN A1C: Hemoglobin A1C: 6.8

## 2023-10-27 LAB — COMPREHENSIVE METABOLIC PANEL WITH GFR: eGFR: 62

## 2023-10-27 LAB — PROTEIN / CREATININE RATIO, URINE
Albumin, U: 27.9
Creatinine, Urine: 167.7

## 2023-10-27 LAB — MICROALBUMIN / CREATININE URINE RATIO: Microalb Creat Ratio: 17

## 2024-02-09 ENCOUNTER — Ambulatory Visit: Payer: Self-pay | Admitting: General Practice

## 2024-02-16 ENCOUNTER — Telehealth: Payer: Self-pay

## 2024-02-16 NOTE — Telephone Encounter (Signed)
 Copied from CRM [id number]. Topic: Clinical - Order For Equipment >> Feb 16, 2024 11:07 AM Terri MATSU wrote: Reason for CRM: [place] from [employer] calling regarding patient CPAP supplies to be ordered. Callback number [phone] >> Feb 16, 2024 11:12 AM CMA Lila C wrote: Wrong office.

## 2024-03-14 ENCOUNTER — Encounter: Payer: Self-pay | Admitting: Family Medicine

## 2024-03-15 ENCOUNTER — Ambulatory Visit: Payer: Self-pay | Admitting: General Practice

## 2024-03-15 ENCOUNTER — Encounter: Payer: Self-pay | Admitting: General Practice

## 2024-03-15 VITALS — BP 130/80 | HR 71 | Temp 98.9°F | Ht 66.5 in | Wt 218.0 lb

## 2024-03-15 DIAGNOSIS — E1165 Type 2 diabetes mellitus with hyperglycemia: Secondary | ICD-10-CM

## 2024-03-15 DIAGNOSIS — Z7689 Persons encountering health services in other specified circumstances: Secondary | ICD-10-CM | POA: Insufficient documentation

## 2024-03-15 DIAGNOSIS — C4359 Malignant melanoma of other part of trunk: Secondary | ICD-10-CM | POA: Insufficient documentation

## 2024-03-15 DIAGNOSIS — E782 Mixed hyperlipidemia: Secondary | ICD-10-CM

## 2024-03-15 DIAGNOSIS — E0829 Diabetes mellitus due to underlying condition with other diabetic kidney complication: Secondary | ICD-10-CM | POA: Insufficient documentation

## 2024-03-15 DIAGNOSIS — E66811 Obesity, class 1: Secondary | ICD-10-CM

## 2024-03-15 DIAGNOSIS — J4520 Mild intermittent asthma, uncomplicated: Secondary | ICD-10-CM

## 2024-03-15 DIAGNOSIS — I10 Essential (primary) hypertension: Secondary | ICD-10-CM | POA: Diagnosis not present

## 2024-03-15 DIAGNOSIS — B351 Tinea unguium: Secondary | ICD-10-CM

## 2024-03-15 DIAGNOSIS — Z96651 Presence of right artificial knee joint: Secondary | ICD-10-CM | POA: Insufficient documentation

## 2024-03-15 DIAGNOSIS — F3181 Bipolar II disorder: Secondary | ICD-10-CM

## 2024-03-15 DIAGNOSIS — F908 Attention-deficit hyperactivity disorder, other type: Secondary | ICD-10-CM

## 2024-03-15 DIAGNOSIS — L309 Dermatitis, unspecified: Secondary | ICD-10-CM

## 2024-03-15 DIAGNOSIS — G4733 Obstructive sleep apnea (adult) (pediatric): Secondary | ICD-10-CM

## 2024-03-15 DIAGNOSIS — Z6834 Body mass index (BMI) 34.0-34.9, adult: Secondary | ICD-10-CM

## 2024-03-15 DIAGNOSIS — M1990 Unspecified osteoarthritis, unspecified site: Secondary | ICD-10-CM

## 2024-03-15 NOTE — Progress Notes (Signed)
 New Patient Office Visit  Subjective    Patient ID: Carla [name], female    DOB: [birth year]  Age: 73 y.o. MRN: [id number]  CC:  Chief Complaint  Patient presents with   New Patient (Initial Visit)    HPI Carla [name] is a 73 y.o. female presents to establish care.  Previous PCP/physical/labs: [facility] [place].   Discussed the use of AI scribe software for clinical note transcription with the patient, who gave verbal consent to proceed.  History of Present Illness Carla [name] is a 73 year old female who presents to establish care.  She has a history of sleep apnea and uses a CPAP machine daily. She would like to reestablish care with a pulmonologist after moving back to the area.  She has diabetes and is currently taking metformin 500 mg once daily. She monitors her blood sugar at home, usually in the morning and sometimes in the afternoon, but has not been consistent in the last few weeks. Her last A1c was 6.8% in May, which was higher than her previous result of 6.4%.  She has high cholesterol and is managed on atorvastatin 80 mg once daily.   She takes lamotrigine 150 mg for mood stabilization following a hysterectomy in 2004, after which she experienced a significant drop in mood. She cannot take hormones due to a family history of breast cancer. She does not currently follow with a psychiatrist. She takes venlafaxine 225 mg for mood and  She has high blood pressure and is taking losartan and hydrochlorothiazide. She did not take her medication on the morning of the visit.  She is on terbinafine for toenail fungus, taking it as directed once a month. She initially took it for a year and is now on maintenance therapy. She uses Dupixent injections for eczema and folliculitis, which she has had for years. She follows with a dermatologist in [place] for these conditions.  She takes Adderall 20 mg daily and albuterol as needed for asthma. She previously  took Vyvanse but switched due to Medicare coverage issues. She has glaucoma and does not currently follow with a psychiatrist.  She has a history of arthritis, particularly affecting her hands and toes, with recent worsening symptoms. She has broken both wrists in the past, which may contribute to her current symptoms.    Outpatient Encounter Medications as of 03/15/2024  Medication Sig   albuterol (PROAIR HFA) 108 (90 BASE) MCG/ACT inhaler Inhale 2 puffs into the lungs every 6 (six) hours as needed for wheezing or shortness of breath.   amphetamine-dextroamphetamine (ADDERALL XR) 20 MG 24 hr capsule Take 20 mg by mouth every morning.   ascorbic acid (VITAMIN C) 1000 MG tablet Take 1,000 mg by mouth.   atorvastatin (LIPITOR) 80 MG tablet Take 80 mg by mouth daily.   Calcium Carbonate (CALCIUM 600 PO) Take by mouth.   Cholecalciferol (VITAMIN D3) 125 MCG (5000 UT) CAPS Take by mouth.   ketoconazole (NIZORAL) 2 % cream    lamoTRIgine (LAMICTAL) 150 MG tablet Take 1 tablet by mouth daily.   latanoprost (XALATAN) 0.005 % ophthalmic solution 1 drop daily.   losartan-hydrochlorothiazide (HYZAAR) 50-12.5 MG tablet Take 1 tablet by mouth every morning.   Magnesium Oxide -Mg Supplement (RA MAGNESIUM) 500 MG CAPS daily.   metFORMIN (GLUCOPHAGE) 500 MG tablet Take 500 mg by mouth daily.   Multiple Vitamins-Minerals (MULTIVITAMIN WOMENS 50+ ADV PO) Take by mouth.   terbinafine (LAMISIL) 250 MG tablet Take  250 mg by mouth as directed. Take 1 tablet daily for 7 days once a month.   venlafaxine (EFFEXOR) 75 MG tablet Take 225 mg by mouth daily.   [DISCONTINUED] venlafaxine XR (EFFEXOR-XR) 150 MG 24 hr capsule Take 1 capsule by mouth daily.   dupilumab (DUPIXENT) 300 MG/2ML prefilled syringe Inject 300 mg into the skin every 14 (fourteen) days.   [DISCONTINUED] atorvastatin (LIPITOR) 20 MG tablet Take 1 tablet by mouth daily.   [DISCONTINUED] BENICAR HCT 20-12.5 MG per tablet Take 1 tablet by mouth daily.    [DISCONTINUED] Fexofenadine HCl (ALLEGRA PO) Take 20 mg by mouth daily. (Patient not taking: Reported on 03/15/2024)   [DISCONTINUED] furosemide (LASIX) 20 MG tablet Take 1 tablet by mouth daily.   [DISCONTINUED] lisdexamfetamine (VYVANSE) 50 MG capsule Take 50 mg by mouth daily.   [DISCONTINUED] pantoprazole (PROTONIX) 40 MG tablet Take 1 tablet by mouth daily.   [DISCONTINUED] simvastatin (ZOCOR) 40 MG tablet  (Patient not taking: Reported on 03/15/2024)   [DISCONTINUED] zaleplon (SONATA) 10 MG capsule Take 1 capsule by mouth daily.   No facility-administered encounter medications on file as of 03/15/2024.    Past Medical History:  Diagnosis Date   GERD (gastroesophageal reflux disease)    Hyperlipidemia    Hypertension     Past Surgical History:  Procedure Laterality Date   ABDOMINAL HYSTERECTOMY     BREAST LUMPECTOMY      Family History  Problem Relation Age of Onset   Breast cancer Maternal Grandmother    Breast cancer Paternal Grandmother     Social History   Socioeconomic History   Marital status: Divorced    Spouse name: Not on file   Number of children: Not on file   Years of education: Not on file   Highest education level: Not on file  Occupational History   Not on file  Tobacco Use   Smoking status: Former    Current packs/day: 0.00    Average packs/day: 1 pack/day for 8.0 years (8.0 ttl pk-yrs)    Types: Cigarettes    Start date: 06/02/1968    Quit date: 06/02/1976    Years since quitting: 47.8   Smokeless tobacco: Never  Substance and Sexual Activity   Alcohol use: No   Drug use: No   Sexual activity: Not on file  Other Topics Concern   Not on file  Social History Narrative   Not on file   Social Drivers of Health   Financial Resource Strain: Low Risk  (10/27/2023)   Received from [facility]   Overall Financial Resource Strain (CARDIA)    Difficulty of Paying Living Expenses: Not hard at all  Food Insecurity: No Food Insecurity (10/27/2023)    Received from [facility]   Hunger Vital Sign    Within the past 12 months, you worried that your food would run out before you got the money to buy more.: Never true    Within the past 12 months, the food you bought just didn't last and you didn't have money to get more.: Never true  Transportation Needs: No Transportation Needs (10/27/2023)   Received from [facility] - Transportation    Lack of Transportation (Medical): No    Lack of Transportation (Non-Medical): No  Physical Activity: Insufficiently Active (10/27/2023)   Received from [facility]   Exercise Vital Sign    On average, how many days per week do you engage in moderate to strenuous exercise (like a brisk walk)?: 2  days    On average, how many minutes do you engage in exercise at this level?: 20 min  Stress: No Stress Concern Present (10/27/2023)   Received from [facility] - Occupational Stress Questionnaire    Feeling of Stress : Not at all  Social Connections: Moderately Integrated (10/27/2023)   Received from [facility]   Social Network    How would you rate your social network (family, work, friends)?: Adequate participation with social networks  Intimate Partner Violence: Not At Risk (10/27/2023)   Received from [facility]   HITS    Over the last 12 months how often did your partner physically hurt you?: Never    Over the last 12 months how often did your partner insult you or talk down to you?: Never    Over the last 12 months how often did your partner threaten you with physical harm?: Never    Over the last 12 months how often did your partner scream or curse at you?: Never    Review of Systems  Constitutional:  Negative for chills and fever.  Respiratory:  Negative for shortness of breath.   Cardiovascular:  Negative for chest pain.  Gastrointestinal:  Negative for abdominal pain, constipation, diarrhea, heartburn, nausea and vomiting.   Genitourinary:  Negative for dysuria, frequency and urgency.  Neurological:  Negative for dizziness and headaches.  Endo/Heme/Allergies:  Negative for polydipsia.  Psychiatric/Behavioral:  Negative for depression and suicidal ideas. The patient is not nervous/anxious.         Objective    BP 130/80   Pulse 71   Temp 98.9 F (37.2 C) (Temporal)   Ht 5' 6.5 (1.689 m)   Wt 218 lb (98.9 kg)   SpO2 96%   BMI 34.66 kg/m   Physical Exam Vitals and nursing note reviewed.  Constitutional:      Appearance: Normal appearance.  Cardiovascular:     Rate and Rhythm: Normal rate and regular rhythm.     Pulses: Normal pulses.     Heart sounds: Normal heart sounds.  Pulmonary:     Effort: Pulmonary effort is normal.     Breath sounds: Normal breath sounds.  Neurological:     Mental Status: She is alert and oriented to person, place, and time.  Psychiatric:        Mood and Affect: Mood normal.        Behavior: Behavior normal.        Thought Content: Thought content normal.        Judgment: Judgment normal.         Assessment & Plan:  OBSTRUCTIVE SLEEP APNEA -     Pulmonary Visit  Establishing care with new doctor, encounter for  Type 2 diabetes mellitus with hyperglycemia, without long-term current use of insulin (HCC)  Bipolar II disorder (HCC)  Other specified attention deficit hyperactivity disorder (ADHD)  Arthritis  Primary hypertension  Mixed hyperlipidemia  Mild intermittent asthma without complication  Eczema, unspecified type  Class 1 obesity due to excess calories with serious comorbidity and body mass index (BMI) of 34.0 to 34.9 in adult  Malignant melanoma of torso excluding breast (HCC)  Diabetes mellitus due to underlying condition with other diabetic kidney complication, without long-term current use of insulin (HCC)  History of total right knee replacement  Onychomycosis    Assessment and Plan Assessment & Plan Establishing primary  care Transitioned care from Dr. Korie Lambert at [facility] in [place]. -  Establish as primary care provider. - Review and update medical history and medications. - Coordinate care with specialists as needed.  Obstructive sleep apnea Managed with CPAP. Re-establishing care with a pulmonologist after moving. - Refer to  [facility] in [place] for management.  Type 2 diabetes mellitus Managed with metformin 500 mg once daily. Last A1c was 6.8% on May 27, slightly higher than previous 6.4%. - Encourage home blood glucose monitoring. - Plan to check A1c and perform lab work at next visit. - Adjust medication if necessary based on home glucose readings and lab results.  Hyperlipidemia Managed with atorvastatin 80 mg once daily. - Perform fasting lipid panel at next visit.  Depression Managed with Lamictal 150 mg and Venalfaxine. Reports well-managed symptoms on current regimen. - Continue current medication regimen. - Discuss potential referral to psychiatry if medication adjustments are needed.  Hypertension Managed with losartan and hydrochlorothiazide. Blood pressure slightly elevated today, likely due to missed dose. - Continue current antihypertensive regimen.  Onychomycosis (toenail fungus) Managed with terbinafine as maintenance therapy. - Continue terbinafine as directed.  Eczema with folliculitis Managed with Dupixent injections. Follows with dermatologist in [place]. - Continue Dupixent as prescribed by dermatologist.  Asthma Managed with albuterol inhaler as needed. - Continue albuterol inhaler as needed.  Glaucoma - Continue Latanoprost.   Attention deficit hyperactivity disorder (ADHD) Managed with Adderall 20 mg daily. Previously on higher dose but adjusted due to Medicare coverage limitations. - Sign ADHD medication contract at next visit. - Schedule follow-up every three months for medication management.  Osteoarthritis (multiple sites, including  hands, toes, right knee) Affects multiple sites, including hands, toes, and right knee. Reports increased pain and difficulty with hand function. - Recommend Tylenol Arthritis 500 mg in the morning and at night. - Apply Voltaren gel up to four times daily. - Use heat or ice as tolerated. - Reassess if no improvement in symptoms.   Return in about 3 months (around 06/15/2024) for chronic care management and fasting labs.SABRA Carrol [place], NP

## 2024-03-15 NOTE — Patient Instructions (Addendum)
 You will either be contacted via phone regarding your referral to pulmonology, or you may receive a letter on your MyChart portal from our referral team with instructions for scheduling an appointment. Please let us  know if you have not been contacted by anyone within two weeks.  Follow up in 3 months for chronic care management.   Let me know if you need refills.   It was a pleasure to meet you today! Please don't hesitate to contact me with any questions. Welcome to [facility]!

## 2024-04-01 ENCOUNTER — Other Ambulatory Visit: Payer: Self-pay | Admitting: General Practice

## 2024-04-01 NOTE — Telephone Encounter (Signed)
 Copied from CRM [id number]. Topic: Clinical - Medication Refill >> Apr 01, 2024  4:16 PM Alfonso ORN wrote: Medication: (ADDERALL XR) 20 MG 24 hr capsule  Has the patient contacted their pharmacy? No   This is the patient's preferred pharmacy:  [id number] [place], [place] - [address] AT [facility] OF [address] [place] [address] Phone: [phone] Fax: [phone]  Is this the correct pharmacy for this prescription? Yes   Has the prescription been filled recently? Yes, every 30 days   Is the patient out of the medication? No  Has the patient been seen for an appointment in the last year OR does the patient have an upcoming appointment? Yes  Can we respond through MyChart? No

## 2024-04-04 NOTE — Telephone Encounter (Signed)
 LOV - 03/15/24 NOV - 06/15/24 RF - unsure; in as historical

## 2024-04-05 MED ORDER — AMPHETAMINE-DEXTROAMPHET ER 20 MG PO CP24: 20.0000 mg | ORAL_CAPSULE | Freq: Every morning | ORAL | 0 refills | Status: DC

## 2024-04-20 ENCOUNTER — Encounter: Payer: Self-pay | Admitting: Internal Medicine

## 2024-04-20 ENCOUNTER — Ambulatory Visit: Admitting: Internal Medicine

## 2024-04-20 VITALS — BP 120/80 | HR 76 | Temp 99.0°F | Ht 67.0 in | Wt 215.2 lb

## 2024-04-20 DIAGNOSIS — E669 Obesity, unspecified: Secondary | ICD-10-CM

## 2024-04-20 DIAGNOSIS — R5381 Other malaise: Secondary | ICD-10-CM

## 2024-04-20 DIAGNOSIS — R0683 Snoring: Secondary | ICD-10-CM | POA: Diagnosis not present

## 2024-04-20 DIAGNOSIS — G4733 Obstructive sleep apnea (adult) (pediatric): Secondary | ICD-10-CM | POA: Diagnosis not present

## 2024-04-20 DIAGNOSIS — J4520 Mild intermittent asthma, uncomplicated: Secondary | ICD-10-CM | POA: Diagnosis not present

## 2024-04-20 NOTE — Progress Notes (Signed)
 Name: Carla [name] MRN: [id number] DOB: Mar 01, 1951    CHIEF COMPLAINT:  ASSESSMENT OF SLEEP APNEA EXCESSIVE DAYTIME SLEEPINESS Assessment of asthma   HISTORY OF PRESENT ILLNESS: Patient is seen today for problems and issues with sleep related to excessive daytime sleepiness Patient  has been having sleep problems for many years Patient has been having excessive daytime sleepiness for a long time Patient has been having extreme fatigue and tiredness, lack of energy Patient diagnosed with sleep apnea 15 years ago Currently on CPAP machine Download unobtainable at this time Patient is very compliant Patient uses nasal mask  Discussed sleep data and reviewed with patient.  Encouraged proper weight management.  Discussed driving precautions and its relationship with hypersomnolence.  Discussed operating dangerous equipment and its relationship with hypersomnolence.  Discussed sleep hygiene, and benefits of a fixed sleep waked time.  The importance of getting eight or more hours of sleep discussed with patient.  Discussed limiting the use of the computer and television before bedtime.  Decrease naps during the day, so night time sleep will become enhanced.  Limit caffeine, and sleep deprivation.  HTN, stroke, and heart failure are potential risk factors.        04/20/2024   10:00 AM  Results of the Epworth flowsheet  Sitting and reading 3  Watching TV 2  Sitting, inactive in a public place (e.g. a theatre or a meeting) 0  As a passenger in a car for an hour without a break 0  Lying down to rest in the afternoon when circumstances permit 1  Sitting and talking to someone 0  Sitting quietly after a lunch without alcohol 0  In a car, while stopped for a few minutes in traffic 0  Total score 6    Assessment of asthma Well-controlled at this time uses albuterol as needed Patient currently on Dupixent for eczema but it also can treat her asthma No asthma attacks in the  last several years History of COVID 2020 Non-smoker nonalcoholic Quit smoking teenager She worked as a retired research scientist (medical)  No exacerbation at this time No evidence of heart failure at this time No evidence or signs of infection at this time No respiratory distress No fevers, chills, nausea, vomiting, diarrhea No evidence of lower extremity edema No evidence hemoptysis   PAST MEDICAL HISTORY :   has a past medical history of GERD (gastroesophageal reflux disease), Hyperlipidemia, and Hypertension.  has a past surgical history that includes Abdominal hysterectomy and Breast lumpectomy. Prior to Admission medications   Medication Sig Start Date End Date Taking? Authorizing Provider  albuterol (PROAIR HFA) 108 (90 BASE) MCG/ACT inhaler Inhale 2 puffs into the lungs every 6 (six) hours as needed for wheezing or shortness of breath.   Yes [provider]  amphetamine-dextroamphetamine (ADDERALL XR) 20 MG 24 hr capsule Take 1 capsule (20 mg total) by mouth every morning. 04/05/24  Yes Vincente Shivers, NP  ascorbic acid (VITAMIN C) 1000 MG tablet Take 1,000 mg by mouth.   Yes [provider]  atorvastatin (LIPITOR) 80 MG tablet Take 80 mg by mouth daily.   Yes [provider]  Calcium Carbonate (CALCIUM 600 PO) Take by mouth.   Yes [provider]  Cholecalciferol (VITAMIN D3) 125 MCG (5000 UT) CAPS Take by mouth.   Yes [provider]  dupilumab (DUPIXENT) 300 MG/2ML prefilled syringe Inject 300 mg into the skin every 14 (fourteen) days.   Yes [provider]  ketoconazole (NIZORAL) 2 %  cream  02/19/23  Yes [provider]  lamoTRIgine (LAMICTAL) 150 MG tablet Take 1 tablet by mouth daily. 06/03/13  Yes [provider]  latanoprost (XALATAN) 0.005 % ophthalmic solution 1 drop daily.   Yes [provider]  losartan-hydrochlorothiazide (HYZAAR) 50-12.5 MG tablet Take 1 tablet by mouth every morning.   Yes  [provider]  Magnesium Oxide -Mg Supplement (RA MAGNESIUM) 500 MG CAPS daily. 03/16/23  Yes [provider]  metFORMIN (GLUCOPHAGE) 500 MG tablet Take 500 mg by mouth daily.   Yes [provider]  Multiple Vitamins-Minerals (MULTIVITAMIN WOMENS 50+ ADV PO) Take by mouth.   Yes [provider]  terbinafine (LAMISIL) 250 MG tablet Take 250 mg by mouth as directed. Take 1 tablet daily for 7 days once a month.   Yes [provider]  venlafaxine (EFFEXOR) 75 MG tablet Take 225 mg by mouth daily.   Yes [provider]   Allergies  Allergen Reactions   Latex    Penicillins Diarrhea    FAMILY HISTORY:  family history includes Breast cancer in her maternal grandmother and paternal grandmother. SOCIAL HISTORY:  reports that she quit smoking about 47 years ago. Her smoking use included cigarettes. She started smoking about 55 years ago. She has a 8 pack-year smoking history. She has never used smokeless tobacco. She reports that she does not drink alcohol and does not use drugs.   BP 120/80   Pulse 76   Temp 99 F (37.2 C)   Ht 5' 7 (1.702 m)   Wt 215 lb 3.2 oz (97.6 kg)   SpO2 93%   BMI 33.71 kg/m       Review of Systems: Gen:  Denies  fever, sweats, chills weight loss  HEENT: Denies blurred vision, double vision, ear pain, eye pain, hearing loss, nose bleeds, sore throat Cardiac:  No dizziness, chest pain or heaviness, chest tightness,edema, No JVD Resp:   No cough, -sputum production, -shortness of breath,-wheezing, -hemoptysis,  Other:  All other systems negative   Physical Examination:   General Appearance: No distress  EYES PERRLA, EOM intact.   NECK Supple, No JVD Pulmonary: normal breath sounds, No wheezing.  CardiovascularNormal S1,S2.  No m/r/g.   Abdomen: Benign, Soft, non-tender. Neurology UE/LE 5/5 strength, no focal deficits Ext pulses intact, cap refill intact ALL OTHER ROS ARE  NEGATIVE     ASSESSMENT AND PLAN SYNOPSIS 73 year old pleasant white female seen today for underlying diagnosis of sleep apnea with underlying diagnosis of asthma obesity and deconditioned state  Assessment of sleep apnea Commend obtaining download for further evaluation Patient  compliant with her CPAP Patient uses nasal mask Patient is near 100% compliance for days and good use and usage of greater than 4 hours AHI reduced to 0.4 Intermittent leaks however is tolerating her mask 2015 sleep study shows AHI of 26  No evidence of acute heart failure at this time No respiratory distress No fevers, chills, nausea, vomiting, diarrhea No evidence hemoptysis  Patient Instructions Continue to use CPAP every night, minimum of 4-6 hours a night.  Change equipment every 30 days or as directed by DME.  Wash your tubing with warm soap and water daily, hang to dry.  Wash humidifier portion weekly. Use bottled, distilled water and change daily  Risk of untreated sleep apnea including cardiac arrhthymias, stroke, DM, pulm HTN.    Assessment of asthma Well-controlled with avoidance of triggers Albuterol as needed She is currently on Dupixent therapy for eczema but  this can also treat her asthma Avoid Allergens and Irritants Avoid secondhand smoke Avoid SICK contacts Recommend  Masking  when appropriate Recommend Keep up-to-date with vaccinations   Obesity -recommend significant weight loss -recommend changing diet  Deconditioned state -Recommend increased daily activity and exercise   Continue CPAP as prescribed Continue Dupixent Avoid allergens Albuterol as needed  CURRENT MEDICATIONS REVIEWED AT LENGTH WITH PATIENT TODAY   Patient  satisfied with Plan of action and management. All questions answered   Follow up 6 months   I spent a total of 65 minutes dedicated to the care of this patient on the date of this encounter to include pre-visit review of records,  face-to-face time with the patient discussing conditions above, post visit ordering of testing, clinical documentation with the electronic health record, making appropriate referrals as documented, and communicating necessary information to the patient's healthcare team.     Nickolas Alm Cellar, M.D.  [facility]  Medical Director [facility] [place]

## 2024-04-20 NOTE — Patient Instructions (Addendum)
      Continue albuterol as needed Continue Dupixent as prescribed Avoid Allergens and Irritants Avoid secondhand smoke Avoid SICK contacts Recommend  Masking  when appropriate Recommend Keep up-to-date with vaccinations  Referral to DME company to obtain supplies

## 2024-05-11 ENCOUNTER — Other Ambulatory Visit: Payer: Self-pay | Admitting: General Practice

## 2024-05-11 NOTE — Telephone Encounter (Signed)
 Copied from CRM #[id number]. Topic: Clinical - Medication Refill >> May 11, 2024  1:50 PM Jayma L wrote: Medication: amphetamine -dextroamphetamine (ADDERALL XR) 20 MG 24 hr capsule  Has the patient contacted their pharmacy? Yes (Agent: If no, request that the patient contact the pharmacy for the refill. If patient does not wish to contact the pharmacy document the reason why and proceed with request.) (Agent: If yes, when and what did the pharmacy advise?)  This is the patient's preferred pharmacy:  [id number] [place], [place] - [address] AT [facility] OF [address] [place] [address] Phone: [phone] Fax: [phone]  Is this the correct pharmacy for this prescription? Yes If no, delete pharmacy and type the correct one.   Has the prescription been filled recently? No  Is the patient out of the medication? Yes  Has the patient been seen for an appointment in the last year OR does the patient have an upcoming appointment? Yes  Can we respond through MyChart? No  Agent: Please be advised that Rx refills may take up to 3 business days. We ask that you follow-up with your pharmacy.

## 2024-05-12 MED ORDER — AMPHETAMINE-DEXTROAMPHET ER 20 MG PO CP24: 20.0000 mg | ORAL_CAPSULE | Freq: Every morning | ORAL | 0 refills | Status: DC

## 2024-05-15 ENCOUNTER — Encounter

## 2024-05-15 DIAGNOSIS — G4733 Obstructive sleep apnea (adult) (pediatric): Secondary | ICD-10-CM

## 2024-05-15 DIAGNOSIS — R0683 Snoring: Secondary | ICD-10-CM

## 2024-05-23 DIAGNOSIS — R0683 Snoring: Secondary | ICD-10-CM | POA: Diagnosis not present

## 2024-05-24 ENCOUNTER — Other Ambulatory Visit: Payer: Self-pay

## 2024-05-24 DIAGNOSIS — R0683 Snoring: Secondary | ICD-10-CM

## 2024-05-24 DIAGNOSIS — G4733 Obstructive sleep apnea (adult) (pediatric): Secondary | ICD-10-CM

## 2024-06-03 ENCOUNTER — Other Ambulatory Visit: Payer: Self-pay | Admitting: General Practice

## 2024-06-03 NOTE — Telephone Encounter (Signed)
 I did not see Lipid Panel for pt. Has only been seen by Carrol Aurora once 03-15-24. Will forward to her to approve or deny 90 days.

## 2024-06-03 NOTE — Telephone Encounter (Signed)
 Copied from CRM [id number]. Topic: Clinical - Medication Refill >> Jun 03, 2024 12:27 PM Alfonso HERO wrote: Medication: atorvastatin (LIPITOR) 80 MG tablet  Has the patient contacted their pharmacy? Yes (Agent: If no, request that the patient contact the pharmacy for the refill. If patient does not wish to contact the pharmacy document the reason why and proceed with request.) (Agent: If yes, when and what did the pharmacy advise?)  This is the patient's preferred pharmacy:  [facility] #[id number] [place], [place] - [address] AT [facility] OF [address] [place] [address] Phone: [phone] Fax: [phone]  Is this the correct pharmacy for this prescription? Yes If no, delete pharmacy and type the correct one.   Has the prescription been filled recently? Yes  Is the patient out of the medication? Yes  Has the patient been seen for an appointment in the last year OR does the patient have an upcoming appointment? Yes  Can we respond through MyChart? Yes  Agent: Please be advised that Rx refills may take up to 3 business days. We ask that you follow-up with your pharmacy.

## 2024-06-04 MED ORDER — ATORVASTATIN CALCIUM 80 MG PO TABS: 80.0000 mg | ORAL_TABLET | Freq: Every day | ORAL | 0 refills | Status: AC

## 2024-06-15 ENCOUNTER — Encounter: Payer: Self-pay | Admitting: General Practice

## 2024-06-15 ENCOUNTER — Ambulatory Visit: Payer: Self-pay | Admitting: General Practice

## 2024-06-15 ENCOUNTER — Ambulatory Visit (INDEPENDENT_AMBULATORY_CARE_PROVIDER_SITE_OTHER): Admitting: General Practice

## 2024-06-15 VITALS — BP 122/66 | HR 75 | Temp 97.7°F | Ht 67.0 in | Wt 218.0 lb

## 2024-06-15 DIAGNOSIS — E66811 Obesity, class 1: Secondary | ICD-10-CM

## 2024-06-15 DIAGNOSIS — E782 Mixed hyperlipidemia: Secondary | ICD-10-CM

## 2024-06-15 DIAGNOSIS — Z6834 Body mass index (BMI) 34.0-34.9, adult: Secondary | ICD-10-CM | POA: Diagnosis not present

## 2024-06-15 DIAGNOSIS — Z7984 Long term (current) use of oral hypoglycemic drugs: Secondary | ICD-10-CM

## 2024-06-15 DIAGNOSIS — C4359 Malignant melanoma of other part of trunk: Secondary | ICD-10-CM

## 2024-06-15 DIAGNOSIS — B351 Tinea unguium: Secondary | ICD-10-CM | POA: Diagnosis not present

## 2024-06-15 DIAGNOSIS — E1165 Type 2 diabetes mellitus with hyperglycemia: Secondary | ICD-10-CM

## 2024-06-15 DIAGNOSIS — F908 Attention-deficit hyperactivity disorder, other type: Secondary | ICD-10-CM

## 2024-06-15 DIAGNOSIS — F3181 Bipolar II disorder: Secondary | ICD-10-CM | POA: Diagnosis not present

## 2024-06-15 DIAGNOSIS — I10 Essential (primary) hypertension: Secondary | ICD-10-CM

## 2024-06-15 DIAGNOSIS — E6609 Other obesity due to excess calories: Secondary | ICD-10-CM | POA: Diagnosis not present

## 2024-06-15 DIAGNOSIS — Z79899 Other long term (current) drug therapy: Secondary | ICD-10-CM | POA: Diagnosis not present

## 2024-06-15 DIAGNOSIS — G4733 Obstructive sleep apnea (adult) (pediatric): Secondary | ICD-10-CM

## 2024-06-15 LAB — LIPID PANEL
Cholesterol: 143 mg/dL (ref 28–200)
HDL: 63.5 mg/dL
LDL Cholesterol: 58 mg/dL (ref 10–99)
NonHDL: 79.12
Total CHOL/HDL Ratio: 2
Triglycerides: 108 mg/dL (ref 10.0–149.0)
VLDL: 21.6 mg/dL (ref 0.0–40.0)

## 2024-06-15 LAB — COMPREHENSIVE METABOLIC PANEL WITH GFR
ALT: 20 U/L (ref 3–35)
AST: 18 U/L (ref 5–37)
Albumin: 4.1 g/dL (ref 3.5–5.2)
Alkaline Phosphatase: 88 U/L (ref 39–117)
BUN: 16 mg/dL (ref 6–23)
CO2: 31 meq/L (ref 19–32)
Calcium: 9.5 mg/dL (ref 8.4–10.5)
Chloride: 100 meq/L (ref 96–112)
Creatinine, Ser: 0.97 mg/dL (ref 0.40–1.20)
GFR: 58.83 mL/min — ABNORMAL LOW
Glucose, Bld: 132 mg/dL — ABNORMAL HIGH (ref 70–99)
Potassium: 4.1 meq/L (ref 3.5–5.1)
Sodium: 138 meq/L (ref 135–145)
Total Bilirubin: 0.8 mg/dL (ref 0.2–1.2)
Total Protein: 6.8 g/dL (ref 6.0–8.3)

## 2024-06-15 LAB — HEMOGLOBIN A1C: Hgb A1c MFr Bld: 7.2 % — ABNORMAL HIGH (ref 4.6–6.5)

## 2024-06-15 LAB — TSH: TSH: 0.97 u[IU]/mL (ref 0.35–5.50)

## 2024-06-15 MED ORDER — BLOOD PRESSURE KIT: PACK | 0 refills | Status: AC

## 2024-06-15 MED ORDER — AMPHETAMINE-DEXTROAMPHET ER 20 MG PO CP24: 20.0000 mg | ORAL_CAPSULE | Freq: Every morning | ORAL | 0 refills | Status: AC

## 2024-06-15 NOTE — Progress Notes (Signed)
 "  Established Patient Office Visit  Subjective   Patient ID: Carla [name], female    DOB: Apr 23, 1951  Age: 74 y.o. MRN: [id number]  Chief Complaint  Patient presents with   chronic care management     Patient here today to follow up on chronic conditions    HPI  Discussed the use of AI scribe software for clinical note transcription with the patient, who gave verbal consent to proceed.  History of Present Illness Carla [name] is a 74 year old female who presents for a three-month follow-up visit on chronic conditions.   She has been using a CPAP machine for her sleep apnea after completing a sleep study. She is following with pulmonology. She does need new supplies and will reach out to pulmonology to get them ordered.  Her diabetes management includes checking her A1c today. She does not currently check her blood sugars at home due to a lack of supplies. She is taking metformin without issues and had a diabetic eye exam in September or October 2025 at [facility] in [place].  She is on medication for eczema, including Dupixent injections, and is following up with a dermatologist. She continues to take terbinafine as well.  She has a history of ADHD, anxiety, depression and bipolar disorder. She is currently taking Adderall and venlafaxine, and has been on venlafaxine for several years. She is also on lamotrigine. She wants to see a psychiatrist for further evaluation of her mental health, including a past experience with a behavioral health facility.  She has a history of malignant melanoma on her torso, diagnosed around 2004, which has resolved without lymph node involvement.  She has a history of HTN. She does not check her BP at home because of misplacing her cuff. She denies any blurred vision, chest pain, headaches.     Patient Active Problem List   Diagnosis Date Noted   Establishing care with new doctor, encounter for 03/15/2024   Primary hypertension 03/15/2024    Mixed hyperlipidemia 03/15/2024   Mild intermittent asthma without complication 03/15/2024   Eczema 03/15/2024   Class 1 obesity due to excess calories with serious comorbidity and body mass index (BMI) of 34.0 to 34.9 in adult 03/15/2024   Malignant melanoma of torso excluding breast (HCC) 03/15/2024   Onychomycosis 03/15/2024   History of total right knee replacement 03/15/2024   Arthritis 02/12/2022   Bipolar II disorder (HCC) 09/10/2021   Glaucoma 05/08/2020   Diabetes mellitus (HCC) 05/08/2020   Attention deficit hyperactivity disorder 05/08/2020   OBSTRUCTIVE SLEEP APNEA 01/21/2008   Past Medical History:  Diagnosis Date   GERD (gastroesophageal reflux disease)    Hyperlipidemia    Hypertension    Past Surgical History:  Procedure Laterality Date   ABDOMINAL HYSTERECTOMY     BREAST LUMPECTOMY     Allergies[1]       06/15/2024   10:00 AM 03/15/2024   10:17 AM  Depression screen PHQ 2/9  Decreased Interest 0 0  Down, Depressed, Hopeless 0 0  PHQ - 2 Score 0 0  Altered sleeping 0 0  Tired, decreased energy 1 2  Change in appetite 2 0  Feeling bad or failure about yourself  1 0  Trouble concentrating 1 1  Moving slowly or fidgety/restless 0 0  Suicidal thoughts 0 0  PHQ-9 Score 5 3   Difficult doing work/chores Not difficult at all Not difficult at all     Data saved with a previous flowsheet row  definition       06/15/2024   10:00 AM 03/15/2024   10:17 AM  GAD 7 : Generalized Anxiety Score  Nervous, Anxious, on Edge 0 1  Control/stop worrying 0 0  Worry too much - different things 0 0  Trouble relaxing 1 0  Restless 0 0  Easily annoyed or irritable 0 0  Afraid - awful might happen 0 0  Total GAD 7 Score 1 1  Anxiety Difficulty Not difficult at all Not difficult at all      Review of Systems  Constitutional:  Negative for chills and fever.  Respiratory:  Negative for shortness of breath.   Cardiovascular:  Negative for chest pain.   Gastrointestinal:  Negative for abdominal pain, constipation, diarrhea, heartburn, nausea and vomiting.  Genitourinary:  Negative for dysuria, frequency and urgency.  Neurological:  Negative for dizziness and headaches.  Endo/Heme/Allergies:  Negative for polydipsia.  Psychiatric/Behavioral:  Negative for depression and suicidal ideas. The patient is not nervous/anxious.       Objective:     BP 122/66   Pulse 75   Temp 97.7 F (36.5 C) (Temporal)   Ht 5' 7 (1.702 m)   Wt 218 lb (98.9 kg)   SpO2 96%   BMI 34.14 kg/m  BP Readings from Last 3 Encounters:  06/15/24 122/66  04/20/24 120/80  03/15/24 130/80   Wt Readings from Last 3 Encounters:  06/15/24 218 lb (98.9 kg)  04/20/24 215 lb 3.2 oz (97.6 kg)  03/15/24 218 lb (98.9 kg)      Physical Exam Vitals and nursing note reviewed.  Constitutional:      Appearance: Normal appearance.  Cardiovascular:     Rate and Rhythm: Normal rate and regular rhythm.     Pulses: Normal pulses.     Heart sounds: Normal heart sounds.  Pulmonary:     Effort: Pulmonary effort is normal.     Breath sounds: Normal breath sounds.  Neurological:     Mental Status: She is alert and oriented to person, place, and time.  Psychiatric:        Mood and Affect: Mood normal.        Behavior: Behavior normal.        Thought Content: Thought content normal.        Judgment: Judgment normal.      Results for orders placed or performed in visit on 06/15/24  Lipid panel  Result Value Ref Range   Cholesterol 143 28 - 200 mg/dL   Triglycerides 891.9 89.9 - 149.0 mg/dL   HDL 36.49 >60.99 mg/dL   VLDL 78.3 0.0 - 59.9 mg/dL   LDL Cholesterol 58 10 - 99 mg/dL   Total CHOL/HDL Ratio 2    NonHDL 79.12   Hemoglobin A1c  Result Value Ref Range   Hgb A1c MFr Bld 7.2 (H) 4.6 - 6.5 %  Comprehensive metabolic panel with GFR  Result Value Ref Range   Sodium 138 135 - 145 mEq/L   Potassium 4.1 3.5 - 5.1 mEq/L   Chloride 100 96 - 112 mEq/L   CO2 31  19 - 32 mEq/L   Glucose, Bld 132 (H) 70 - 99 mg/dL   BUN 16 6 - 23 mg/dL   Creatinine, Ser 9.02 0.40 - 1.20 mg/dL   Total Bilirubin 0.8 0.2 - 1.2 mg/dL   Alkaline Phosphatase 88 39 - 117 U/L   AST 18 5 - 37 U/L   ALT 20 3 - 35 U/L   Total  Protein 6.8 6.0 - 8.3 g/dL   Albumin 4.1 3.5 - 5.2 g/dL   GFR 41.16 (L) >39.99 mL/min   Calcium  9.5 8.4 - 10.5 mg/dL  TSH  Result Value Ref Range   TSH 0.97 0.35 - 5.50 uIU/mL       The 10-year ASCVD risk score (Arnett DK, et al., 2019) is: 21.1%    Assessment & Plan:  Mixed hyperlipidemia -     Lipid panel -     TSH -     DRUG MONITORING, PANEL 8 WITH CONFIRMATION, URINE  Primary hypertension -     DRUG MONITORING, PANEL 8 WITH CONFIRMATION, URINE -     Blood Pressure; Please check your blood pressure daily.  Dispense: 1 kit; Refill: 0  Type 2 diabetes mellitus with hyperglycemia, without long-term current use of insulin (HCC) -     Hemoglobin A1c -     Comprehensive metabolic panel with GFR -     DRUG MONITORING, PANEL 8 WITH CONFIRMATION, URINE  Other specified attention deficit hyperactivity disorder (ADHD) -     Amphetamine -Dextroamphet ER; Take 1 capsule (20 mg total) by mouth every morning.  Dispense: 30 capsule; Refill: 0 -     Ambulatory referral to Psychiatry -     DRUG MONITORING, PANEL 8 WITH CONFIRMATION, URINE  Bipolar II disorder (HCC) -     Ambulatory referral to Psychiatry -     DRUG MONITORING, PANEL 8 WITH CONFIRMATION, URINE  Long-term use of high-risk medication -     DRUG MONITORING, PANEL 8 WITH CONFIRMATION, URINE  Malignant melanoma of torso excluding breast (HCC) Assessment & Plan: Diagnosed in 2004.  Resolved.   Orders: -     DRUG MONITORING, PANEL 8 WITH CONFIRMATION, URINE  Onychomycosis  OBSTRUCTIVE SLEEP APNEA  Class 1 obesity due to excess calories with serious comorbidity and body mass index (BMI) of 34.0 to 34.9 in adult    Assessment and Plan Assessment & Plan Type 2 diabetes  mellitus with hyperglycemia Diabetes management ongoing with metformin. Considering Mounjaro  for improved glycemic control and weight loss pending A1c results. - Checked A1c today. - Consider Mounjaro  based on A1c results. - Continue metformin. - Ordered diabetic foot exam for next visit.  Primary hypertension Blood pressure well-controlled with current regimen. - Continue current antihypertensive medications. - Ordered blood pressure cuff for home monitoring.  Mixed hyperlipidemia Cholesterol levels to be checked today. - Checked cholesterol levels today.  Bipolar II disorder Managed with venlafaxine and lamotrigine. Referred to psychiatry for further evaluation. - Referred to [facility] in [place]. - Continue venlafaxine and lamotrigine.  Other specified attention deficit hyperactivity disorder (ADHD) ADHD managed with Adderall. Medication management contract discussed. - Refilled Adderall prescription. - UDS pending.  Obstructive sleep apnea Managed with CPAP. Compliance issues noted. - Contact pulmonologist's office for CPAP supplies.  Class 1 obesity, BMI 34.0-34.9 Weight management discussed in context of diabetes and sleep apnea. Mounjaro  considered for weight loss benefits. - Consider Mounjaro  for weight loss based on A1c results.  Onychomycosis Managed with terbinafine. Follow-up with dermatologist ongoing. - Continue terbinafine. - Continue follow-up with dermatologist.   Return in about 3 months (around 09/13/2024) for ADHD.    Carrol Aurora, NP     [1]  Allergies Allergen Reactions   Latex    Penicillins Diarrhea   "

## 2024-06-15 NOTE — Patient Instructions (Addendum)
 Stop by the lab prior to leaving today. I will notify you of your results once received.   Call and schedule mammogram with GYN.  Follow up in 3 months and physical.   It was a pleasure to see you today!

## 2024-06-15 NOTE — Assessment & Plan Note (Signed)
 Diagnosed in 2004.  Resolved.

## 2024-06-16 LAB — DRUG MONITORING, PANEL 8 WITH CONFIRMATION, URINE
6 Acetylmorphine: NEGATIVE ng/mL
Alcohol Metabolites: NEGATIVE ng/mL
Amphetamines: NEGATIVE ng/mL
Benzodiazepines: NEGATIVE ng/mL
Buprenorphine, Urine: NEGATIVE ng/mL
Cocaine Metabolite: NEGATIVE ng/mL
Creatinine: 99.5 mg/dL
MDMA: NEGATIVE ng/mL
Marijuana Metabolite: NEGATIVE ng/mL
Opiates: NEGATIVE ng/mL
Oxidant: NEGATIVE ug/mL
Oxycodone: NEGATIVE ng/mL
pH: 8 (ref 4.5–9.0)

## 2024-06-16 LAB — DM TEMPLATE

## 2024-06-17 MED ORDER — TIRZEPATIDE 2.5 MG/0.5ML ~~LOC~~ SOAJ: 2.5000 mg | SUBCUTANEOUS | 0 refills | Status: AC

## 2024-06-21 ENCOUNTER — Encounter (INDEPENDENT_AMBULATORY_CARE_PROVIDER_SITE_OTHER): Payer: Self-pay

## 2024-06-21 ENCOUNTER — Ambulatory Visit: Payer: Self-pay | Admitting: Internal Medicine

## 2024-06-21 DIAGNOSIS — G4733 Obstructive sleep apnea (adult) (pediatric): Secondary | ICD-10-CM

## 2024-06-21 DIAGNOSIS — R0683 Snoring: Secondary | ICD-10-CM

## 2024-06-28 ENCOUNTER — Telehealth: Payer: Self-pay | Admitting: General Practice

## 2024-06-28 DIAGNOSIS — F3181 Bipolar II disorder: Secondary | ICD-10-CM

## 2024-06-28 NOTE — Telephone Encounter (Signed)
 Copied from CRM #[id number]. Topic: Clinical - Medication Refill >> Jun 27, 2024  4:39 PM Rea ORN wrote: Medication:  venlafaxine  (EFFEXOR ) 75 MG tablet, 90 day supply requested    Has the patient contacted their pharmacy? No (Agent: If no, request that the patient contact the pharmacy for the refill. If patient does not wish to contact the pharmacy document the reason why and proceed with request.) (Agent: If yes, when and what did the pharmacy advise?)  This is the patient's preferred pharmacy:  [facility] #[id number] [place], [place] - [address] AT [facility] OF [address] [place] [address] Phone: [phone] Fax: [phone]  Is this the correct pharmacy for this prescription? Yes If no, delete pharmacy and type the correct one.   Has the prescription been filled recently? No  Is the patient out of the medication? No  Has the patient been seen for an appointment in the last year OR does the patient have an upcoming appointment? Yes  Can we respond through MyChart? No  Agent: Please be advised that Rx refills may take up to 3 business days. We ask that you follow-up with your pharmacy.

## 2024-06-29 MED ORDER — VENLAFAXINE HCL 75 MG PO TABS: 225.0000 mg | ORAL_TABLET | Freq: Every day | ORAL | 0 refills | Status: AC

## 2024-06-29 NOTE — Addendum Note (Signed)
 Addended by: VINCENTE SHIVERS on: 06/29/2024 01:21 PM   Modules accepted: Orders

## 2024-06-29 NOTE — Telephone Encounter (Signed)
 Called patient verified she is on the venlafaxin 225mg . She has not heard from referral but I have provided patient with contact number for psych office. She will reach out to them.

## 2024-07-20 ENCOUNTER — Encounter: Admitting: Dietician

## 2024-08-25 ENCOUNTER — Ambulatory Visit: Admitting: Psychiatry

## 2024-09-13 ENCOUNTER — Ambulatory Visit: Admitting: General Practice
